# Patient Record
Sex: Male | Born: 1980 | Race: Black or African American | Hispanic: No | Marital: Married | Smoking: Never smoker
Health system: Southern US, Community
[De-identification: ages and names within clinical notes are randomized; demographics above are authoritative.]

## PROBLEM LIST (undated history)

## (undated) DIAGNOSIS — E119 Type 2 diabetes mellitus without complications: Secondary | ICD-10-CM

## (undated) DIAGNOSIS — S86019A Strain of unspecified Achilles tendon, initial encounter: Secondary | ICD-10-CM

## (undated) HISTORY — PX: PROSTATE BIOPSY: SHX241

## (undated) HISTORY — PX: ABDOMINAL HERNIA REPAIR: SHX539

---
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
## 2005-05-04 ENCOUNTER — Emergency Department (HOSPITAL_COMMUNITY): Admission: EM | Admit: 2005-05-04 | Discharge: 2005-05-04 | Payer: Self-pay | Admitting: Emergency Medicine

## 2005-05-05 ENCOUNTER — Emergency Department (HOSPITAL_COMMUNITY): Admission: EM | Admit: 2005-05-05 | Discharge: 2005-05-05 | Payer: Self-pay | Admitting: Emergency Medicine

## 2019-09-15 DIAGNOSIS — N4601 Organic azoospermia: Secondary | ICD-10-CM

## 2019-09-15 HISTORY — DX: Organic azoospermia: N46.01

## 2020-07-21 ENCOUNTER — Encounter (HOSPITAL_COMMUNITY): Payer: Self-pay

## 2020-07-21 ENCOUNTER — Other Ambulatory Visit: Payer: Self-pay

## 2020-07-21 ENCOUNTER — Emergency Department (HOSPITAL_COMMUNITY)
Admission: EM | Admit: 2020-07-21 | Discharge: 2020-07-21 | Disposition: A | Discharge status: Home / Self Care | Payer: Self-pay | Attending: Emergency Medicine | Admitting: Emergency Medicine

## 2020-07-21 ENCOUNTER — Emergency Department (HOSPITAL_COMMUNITY): Payer: Self-pay

## 2020-07-21 DIAGNOSIS — S97102A Crushing injury of unspecified left toe(s), initial encounter: Secondary | ICD-10-CM

## 2020-07-21 DIAGNOSIS — Y93E6 Activity, residential relocation: Secondary | ICD-10-CM | POA: Insufficient documentation

## 2020-07-21 DIAGNOSIS — S92425A Nondisplaced fracture of distal phalanx of left great toe, initial encounter for closed fracture: Secondary | ICD-10-CM | POA: Insufficient documentation

## 2020-07-21 DIAGNOSIS — W231XXA Caught, crushed, jammed, or pinched between stationary objects, initial encounter: Secondary | ICD-10-CM | POA: Insufficient documentation

## 2020-07-21 DIAGNOSIS — Y999 Unspecified external cause status: Secondary | ICD-10-CM | POA: Insufficient documentation

## 2020-07-21 DIAGNOSIS — Y92009 Unspecified place in unspecified non-institutional (private) residence as the place of occurrence of the external cause: Secondary | ICD-10-CM | POA: Insufficient documentation

## 2020-07-21 DIAGNOSIS — S97112A Crushing injury of left great toe, initial encounter: Secondary | ICD-10-CM | POA: Insufficient documentation

## 2020-07-21 MED ORDER — HYDROCODONE-ACETAMINOPHEN 5-325 MG PO TABS
1.0000 | ORAL_TABLET | Freq: Four times a day (QID) | ORAL | 0 refills | Status: DC | PRN
Start: 2020-07-21 — End: 2022-12-25

## 2020-07-21 NOTE — ED Triage Notes (Signed)
Pt presents with c/o right foot injury. Pt reports that on Friday, a lift gate fell on his foot. Pt reports swelling and pain with ambulation.

## 2020-07-21 NOTE — ED Provider Notes (Signed)
[facility] EMERGENCY DEPARTMENT Provider Note  CSN: [id number] Arrival date & time: 07/21/20 8676    History Chief Complaint  Patient presents with  . Foot Injury    HPI  Timothy Rivera is a 39 y.o. male reports he was helping a friend move 2 days ago when he dropped a lift gate on his R great toe. Complaining of moderate to severe pain and swelling since. Worse with bearing weight.   History reviewed. No pertinent past medical history.  Past Surgical History:  Procedure Laterality Date  . ABDOMINAL HERNIA REPAIR      No family history on file.  Social History   Tobacco Use  . Smoking status: Never Smoker  . Smokeless tobacco: Never Used  Substance Use Topics  . Alcohol use: Yes    Comment: socially   . Drug use: Never     Home Medications Prior to Admission medications   Medication Sig Start Date End Date Taking? Authorizing Provider  acetaminophen (TYLENOL) 325 MG tablet Take 650 mg by mouth every 6 (six) hours as needed for mild pain, fever or headache.   Yes [provider]  HYDROcodone-acetaminophen (NORCO/VICODIN) 5-325 MG tablet Take 1 tablet by mouth every 6 (six) hours as needed for severe pain. 07/21/20   Pollyann Savoy, MD     Allergies    Patient has no known allergies.   Review of Systems   Review of Systems A comprehensive review of systems was completed and negative except as noted in HPI.    Physical Exam BP (!) 144/106 (BP Location: Right Arm)   Pulse 84   Temp 98.2 F (36.8 C) (Oral)   Resp 18   SpO2 98%   Physical Exam Vitals and nursing note reviewed.  HENT:     Head: Normocephalic.     Nose: Nose normal.  Eyes:     Extraocular Movements: Extraocular movements intact.  Pulmonary:     Effort: Pulmonary effort is normal.  Musculoskeletal:        General: Normal range of motion.     Cervical back: Neck supple.     Comments: Marked swelling and tenderness to R great toe with a large fluid and blood filled  blister that extends under the nail  Skin:    Findings: No rash (on exposed skin).  Neurological:     Mental Status: He is alert and oriented to person, place, and time.  Psychiatric:        Mood and Affect: Mood normal.        ED Results / Procedures / Treatments   Labs (all labs ordered are listed, but only abnormal results are displayed) Labs Reviewed - No data to display  EKG None  Radiology DG Toe Great Right  Result Date: 07/21/2020 CLINICAL DATA:  Pt presented with c/o right great toe injury after a lift gate from a truck fell on his right foot two days ago. Pt had swelling to his right great toe and pain with ambulation. EXAM: RIGHT GREAT TOE COMPARISON:  None. FINDINGS: There is a complex nondisplaced fracture involving the tuft of the great toe. No evidence of dislocation. No significant degenerative change. Regional soft tissues are unremarkable. IMPRESSION: Complex nondisplaced fracture involving the tuft of the right great toe. Electronically Signed   By: Emmaline Kluver M.D.   On: 07/21/2020 09:12    Procedures .Nerve Block  Date/Time: 07/21/2020 9:48 AM Performed by: Pollyann Savoy, MD Authorized by: Pollyann Savoy, MD  Consent:    Consent obtained:  Verbal   Consent given by:  Patient Indications:    Indications:  Pain relief Location:    Body area:  Lower extremity   Lower extremity nerve blocked: toe.   Laterality:  Left Pre-procedure details:    Skin preparation:  Alcohol Procedure details (see MAR for exact dosages):    Anesthetic injected:  Lidocaine 1% w/o epi Post-procedure details:    Dressing:  None   Patient tolerance of procedure:  Tolerated well, no immediate complications    Medications Ordered in the ED Medications - No data to display   MDM Rules/Calculators/A&P MDM Patient with crush injury of toe, now with blood filled blister. Will check xray for underlying fracture.   ED Course  I have reviewed the triage vital  signs and the nursing notes.  Pertinent labs & imaging results that were available during my care of the patient were reviewed by me and considered in my medical decision making (see chart for details).  Clinical Course as of Jul 21 1044  Wynelle Link Jul 21, 2020  9242 Xrays reviewed with patient. Digital block for comfort. Will discuss with Ortho management of this fracture in light of his large blister.    [CS]  1038 Spoke with Dr. Ranell Patrick who does not recommend opening blister due to concerns for open fracture. Recommends ace wrapping foot, post-op shoe. Outpatient follow up.    [CS]    Clinical Course User Index [CS] Pollyann Savoy, MD    Final Clinical Impression(s) / ED Diagnoses Final diagnoses:  Crushing injury of toe of left foot, initial encounter  Closed nondisplaced fracture of distal phalanx of left great toe, initial encounter    Rx / DC Orders ED Discharge Orders         Ordered    HYDROcodone-acetaminophen (NORCO/VICODIN) 5-325 MG tablet  Every 6 hours PRN        07/21/20 1041           Pollyann Savoy, MD 07/21/20 1045

## 2021-08-24 IMAGING — CR DG TOE GREAT 2+V*R*
3 series · 3 of 3 positions shown · non-contrast
Comparison: None.

CLINICAL DATA: Pt presented with c/o right great toe injury after a
lift gate from a truck fell on his right foot two days ago. Pt had
swelling to his right great toe and pain with ambulation.

EXAM:
RIGHT GREAT TOE

[x toes ap right]
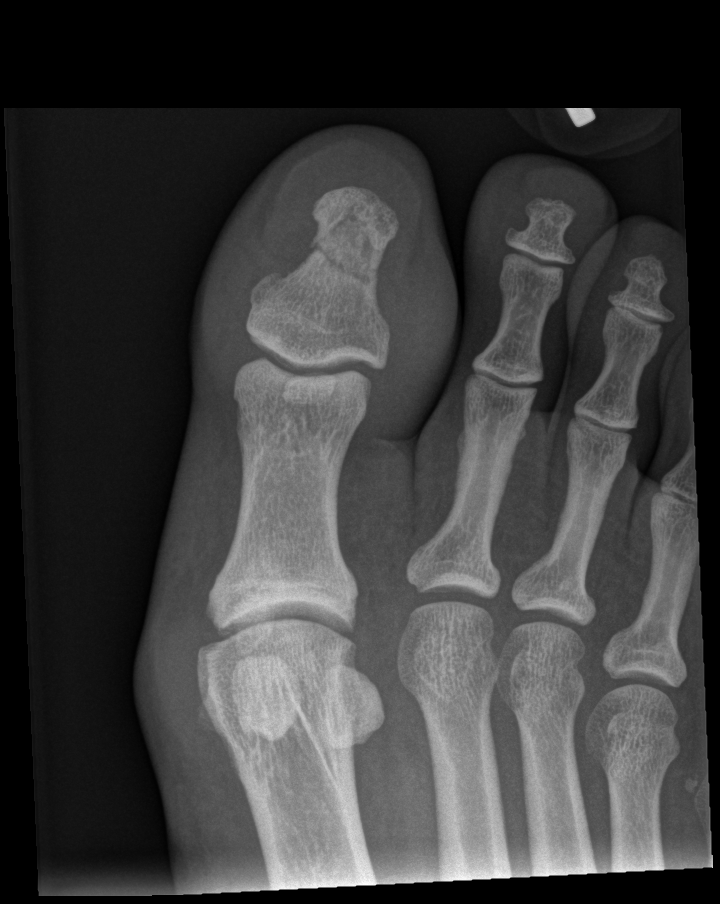

[x toes obl right]
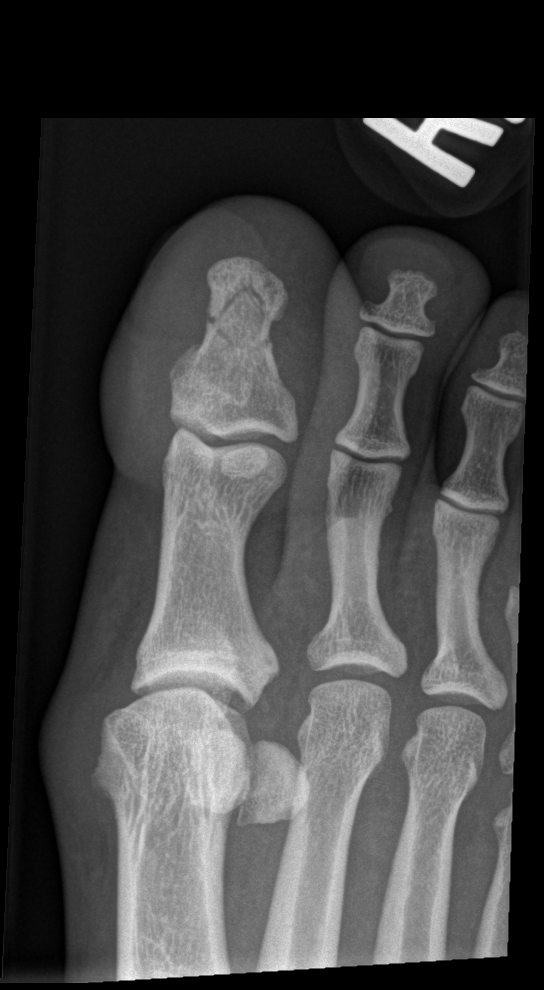

[x toes lat right]
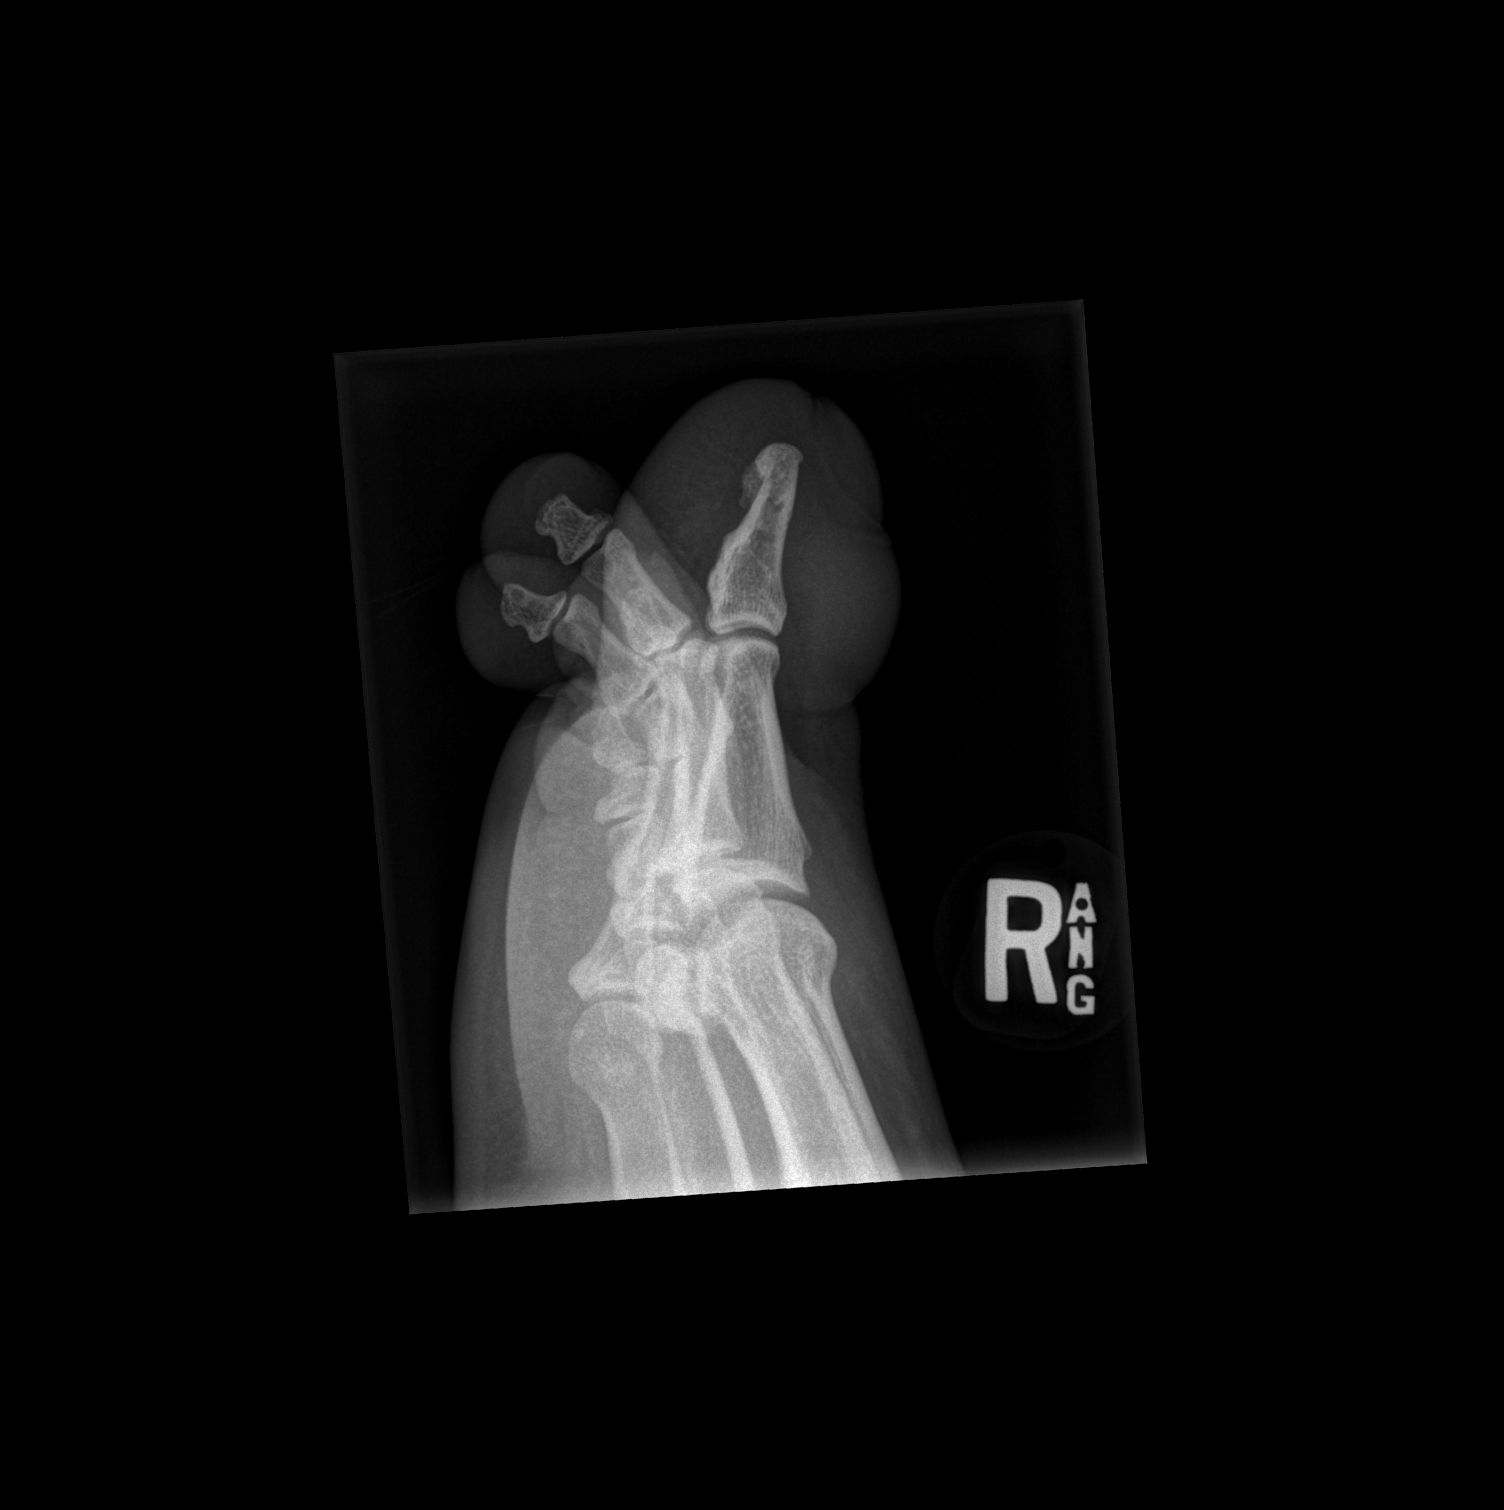

[3 of 3 positions shown; findings below may reference images not displayed]

FINDINGS: There is a complex nondisplaced fracture involving the tuft of the
great toe. No evidence of dislocation. No significant degenerative
change. Regional soft tissues are unremarkable.
IMPRESSION: Complex nondisplaced fracture involving the tuft of the right great
toe.

## 2022-09-24 ENCOUNTER — Ambulatory Visit: Payer: Self-pay | Admitting: Internal Medicine

## 2022-11-05 DIAGNOSIS — R972 Elevated prostate specific antigen [PSA]: Secondary | ICD-10-CM

## 2022-11-05 HISTORY — DX: Elevated prostate specific antigen (PSA): R97.20

## 2022-11-10 ENCOUNTER — Other Ambulatory Visit: Payer: Self-pay | Admitting: Orthopaedic Surgery

## 2022-11-10 DIAGNOSIS — S86012A Strain of left Achilles tendon, initial encounter: Secondary | ICD-10-CM

## 2022-11-11 ENCOUNTER — Ambulatory Visit: Payer: Self-pay | Admitting: Family Medicine

## 2022-11-12 DIAGNOSIS — R310 Gross hematuria: Secondary | ICD-10-CM

## 2022-11-12 HISTORY — DX: Gross hematuria: R31.0

## 2022-11-19 ENCOUNTER — Other Ambulatory Visit: Payer: Self-pay | Admitting: Urology

## 2022-11-19 DIAGNOSIS — N4341 Spermatocele of epididymis, single: Secondary | ICD-10-CM

## 2022-11-23 ENCOUNTER — Other Ambulatory Visit: Payer: Self-pay

## 2022-11-25 ENCOUNTER — Other Ambulatory Visit: Payer: Self-pay

## 2022-11-26 ENCOUNTER — Other Ambulatory Visit: Payer: Self-pay

## 2022-11-26 ENCOUNTER — Other Ambulatory Visit (HOSPITAL_COMMUNITY): Payer: Self-pay | Admitting: Urology

## 2022-11-26 ENCOUNTER — Inpatient Hospital Stay
Admission: RE | Admit: 2022-11-26 | Discharge: 2022-11-26 | Disposition: A | Discharge status: Home / Self Care | Payer: Self-pay | Source: Ambulatory Visit | Attending: Radiation Oncology | Admitting: Radiation Oncology

## 2022-11-26 DIAGNOSIS — C61 Malignant neoplasm of prostate: Secondary | ICD-10-CM

## 2022-11-26 DIAGNOSIS — C519 Malignant neoplasm of vulva, unspecified: Secondary | ICD-10-CM

## 2022-11-27 ENCOUNTER — Telehealth: Payer: Self-pay

## 2022-11-27 NOTE — Telephone Encounter (Signed)
    1. I confirmed with the patient he is aware of his referral to the clinic 4/12, arriving @ 8:00 am   2. I discussed the format of the clinic and the physicians he will be seeing that day.   3. I discussed where the clinic is located and how to contact me.   4. I confirmed his address and informed him I would be mailing a packet of information and forms to be completed. I asked him to bring them with him the day of his appointment.    He voiced understanding of the above. I asked him to call me if he has any questions or concerns regarding his appointments or the forms he needs to complete.

## 2022-12-02 ENCOUNTER — Other Ambulatory Visit: Payer: Self-pay

## 2022-12-15 ENCOUNTER — Encounter (HOSPITAL_COMMUNITY)
Admission: RE | Admit: 2022-12-15 | Discharge: 2022-12-15 | Disposition: A | Discharge status: Home / Self Care | Payer: 59 | Source: Ambulatory Visit | Attending: Urology | Admitting: Urology

## 2022-12-15 DIAGNOSIS — C778 Secondary and unspecified malignant neoplasm of lymph nodes of multiple regions: Secondary | ICD-10-CM | POA: Diagnosis present

## 2022-12-15 DIAGNOSIS — C519 Malignant neoplasm of vulva, unspecified: Secondary | ICD-10-CM | POA: Insufficient documentation

## 2022-12-15 MED ORDER — PIFLIFOLASTAT F 18 (PYLARIFY) INJECTION
9.0000 | Freq: Once | INTRAVENOUS | Status: AC
  Administered 2022-12-15: 9.14 via INTRAVENOUS

## 2022-12-24 ENCOUNTER — Encounter: Payer: Self-pay | Admitting: Urology

## 2022-12-24 DIAGNOSIS — C61 Malignant neoplasm of prostate: Secondary | ICD-10-CM | POA: Insufficient documentation

## 2022-12-24 NOTE — Progress Notes (Signed)
                               Care Plan Summary  Name: Timothy Rivera DOB: 08-17-1981   Your Medical Team:   Urologist -  Dr. Heloise Purpura, [facility]  Radiation Oncologist - Dr. Margaretmary Dys, [facility]   Medical Oncologist - Dr. Malachy Mood, [facility]  Recommendations: 1) Hormonal therapy  2) Chemotherapy    * These recommendations are based on information available as of today's consult.      Recommendations may change depending on the results of further tests or exams.      When appointments need to be scheduled, you will be contacted by [facility] and/or [facility].  Questions?  Please do not hesitate to call Cherlyn Cushing, BSN, RN at [phone] with any questions or concerns.  Marisue Ivan is your Oncology Nurse Navigator and is available to assist you while you're receiving your medical care at [facility].

## 2022-12-24 NOTE — Progress Notes (Signed)
RN attempted to reach patient for reminder of upcoming [facility] appointment on 4/12.    Voicemail box full.  My Chart message sent for reminder.

## 2022-12-25 ENCOUNTER — Ambulatory Visit: Payer: Self-pay | Admitting: Hematology

## 2022-12-25 ENCOUNTER — Inpatient Hospital Stay: Payer: 59

## 2022-12-25 ENCOUNTER — Other Ambulatory Visit: Payer: Self-pay | Admitting: Genetic Counselor

## 2022-12-25 ENCOUNTER — Encounter: Payer: Self-pay | Admitting: Genetic Counselor

## 2022-12-25 ENCOUNTER — Inpatient Hospital Stay: Payer: 59 | Attending: Hematology | Admitting: Hematology

## 2022-12-25 ENCOUNTER — Encounter: Payer: Self-pay | Admitting: Hematology

## 2022-12-25 ENCOUNTER — Ambulatory Visit
Admission: RE | Admit: 2022-12-25 | Discharge: 2022-12-25 | Disposition: A | Discharge status: Home / Self Care | Payer: 59 | Source: Ambulatory Visit | Attending: Radiation Oncology | Admitting: Radiation Oncology

## 2022-12-25 ENCOUNTER — Inpatient Hospital Stay (HOSPITAL_BASED_OUTPATIENT_CLINIC_OR_DEPARTMENT_OTHER): Payer: 59 | Admitting: Genetic Counselor

## 2022-12-25 ENCOUNTER — Encounter: Payer: Self-pay | Admitting: Radiation Oncology

## 2022-12-25 VITALS — BP 140/90 | HR 107 | Temp 96.8°F | Resp 20 | Ht 74.0 in | Wt 268.0 lb

## 2022-12-25 DIAGNOSIS — Z1379 Encounter for other screening for genetic and chromosomal anomalies: Secondary | ICD-10-CM

## 2022-12-25 DIAGNOSIS — C61 Malignant neoplasm of prostate: Secondary | ICD-10-CM | POA: Insufficient documentation

## 2022-12-25 DIAGNOSIS — C7800 Secondary malignant neoplasm of unspecified lung: Secondary | ICD-10-CM | POA: Diagnosis not present

## 2022-12-25 DIAGNOSIS — E119 Type 2 diabetes mellitus without complications: Secondary | ICD-10-CM | POA: Diagnosis not present

## 2022-12-25 DIAGNOSIS — Z7984 Long term (current) use of oral hypoglycemic drugs: Secondary | ICD-10-CM | POA: Diagnosis not present

## 2022-12-25 DIAGNOSIS — Z801 Family history of malignant neoplasm of trachea, bronchus and lung: Secondary | ICD-10-CM | POA: Insufficient documentation

## 2022-12-25 DIAGNOSIS — Z5111 Encounter for antineoplastic chemotherapy: Secondary | ICD-10-CM | POA: Insufficient documentation

## 2022-12-25 DIAGNOSIS — C7951 Secondary malignant neoplasm of bone: Secondary | ICD-10-CM | POA: Insufficient documentation

## 2022-12-25 DIAGNOSIS — I10 Essential (primary) hypertension: Secondary | ICD-10-CM | POA: Diagnosis not present

## 2022-12-25 HISTORY — DX: Type 2 diabetes mellitus without complications: E11.9

## 2022-12-25 HISTORY — DX: Strain of unspecified achilles tendon, initial encounter: S86.019A

## 2022-12-25 LAB — GENETIC SCREENING ORDER

## 2022-12-25 NOTE — Progress Notes (Signed)
REFERRING PROVIDER: Malachy Mood, MD  PRIMARY PROVIDER:  Orpha Bur, MD  PRIMARY REASON FOR VISIT:  Encounter Diagnosis  Name Primary?   Malignant neoplasm of prostate Yes    HISTORY OF PRESENT ILLNESS:   Timothy Rivera, a 42 y.o. male, was seen for a [place] cancer genetics consultation at the request of Timothy Rivera due to a personal history of cancer.  Timothy Rivera presents to clinic today to discuss the possibility of a hereditary predisposition to cancer, to discuss genetic testing, and to further clarify his future cancer risks, as well as potential cancer risks for family members.   Timothy Rivera was diagnosed with metastatic prostate cancer at age 40 (Gleason score: 9).   CANCER HISTORY:  Oncology History Overview Note   Cancer Staging  Malignant neoplasm of prostate Staging form: Prostate, AJCC 8th Edition - Clinical stage from 11/05/2022: Stage IVB (cT1c, cN1, pM1c, PSA: 162, Grade Group: 5) - Signed by Timothy Fennel, PA-C on 12/24/2022 Histopathologic type: Adenocarcinoma, NOS Stage prefix: Initial diagnosis Prostate specific antigen (PSA) range: 20 or greater Gleason primary pattern: 4 Gleason secondary pattern: 5 Gleason score: 9 Histologic grading system: 5 grade system Number of biopsy cores examined: 12 Number of biopsy cores positive: 12     Malignant neoplasm of prostate  11/05/2022 Cancer Staging   Staging form: Prostate, AJCC 8th Edition - Clinical stage from 11/05/2022: Stage IVB (cT1c, cN1, pM1c, PSA: 162, Grade Group: 5) - Signed by Timothy Fennel, PA-C on 12/24/2022 Histopathologic type: Adenocarcinoma, NOS Stage prefix: Initial diagnosis Prostate specific antigen (PSA) range: 20 or greater Gleason primary pattern: 4 Gleason secondary pattern: 5 Gleason score: 9 Histologic grading system: 5 grade system Number of biopsy cores examined: 12 Number of biopsy cores positive: 12   12/15/2022 PET scan    IMPRESSION: 1. Prostatic primary or primaries with nodal  metastasis within the chest, abdomen, and pelvis. 2. The hepatic lesion on prior diagnostic CT is not tracer avid, arguing against metastatic disease. Likely an incidental benign lesion. 3. Widespread osseous metastasis.     12/24/2022 Initial Diagnosis   Malignant neoplasm of prostate      No past medical history on file.  Past Surgical History:  Procedure Laterality Date   ABDOMINAL HERNIA REPAIR      Social History   Socioeconomic History   Marital status: Married    Spouse name: Not on file   Number of children: Not on file   Years of education: Not on file   Highest education level: Not on file  Occupational History   Not on file  Tobacco Use   Smoking status: Never   Smokeless tobacco: Never  Substance and Sexual Activity   Alcohol use: Yes    Comment: socially    Drug use: Never   Sexual activity: Not on file  Other Topics Concern   Not on file  Social History Narrative   Not on file   Social Determinants of Health   Financial Resource Strain: Not on file  Food Insecurity: Not on file  Transportation Needs: Not on file  Physical Activity: Not on file  Stress: Not on file  Social Connections: Not on file     FAMILY HISTORY:  We obtained a detailed, 4-generation family history.  Significant diagnoses are listed below: Family History  Problem Relation Age of Onset   Cancer Maternal Grandmother 63       unknown type   Lung cancer Other  heavy smoker   Timothy Rivera is unaware of previous family history of genetic testing for hereditary cancer risks and there is no reported Ashkenazi Jewish ancestry.      GENETIC COUNSELING ASSESSMENT: Timothy Rivera is a 42 y.o. male with a personal history of cancer which is somewhat suggestive of a hereditary predisposition to cancer given his metastatic prostate cancer. We, therefore, discussed and recommended the following at today's visit.   DISCUSSION: We discussed that 5 - 10% of cancer is hereditary, with  most cases of prostate cancer associated with BRCA1/2.  There are other genes that can be associated with hereditary prostate cancer syndromes.  We discussed that testing is beneficial for several reasons including knowing how to follow individuals after completing their treatment, identifying whether potential treatment options would be beneficial, and understanding if other family members could be at risk for cancer and allowing them to undergo genetic testing.   We reviewed the characteristics, features and inheritance patterns of hereditary cancer syndromes. We also discussed genetic testing, including the appropriate family members to test, the process of testing, insurance coverage and turn-around-time for results. We discussed the implications of a negative, positive, carrier and/or variant of uncertain significant result. We recommended Timothy Rivera pursue genetic testing for a panel that includes genes associated with prostate cancer.   Timothy Rivera elected to have [facility] Custom Panel. The Custom Hereditary Cancers Panel offered by [facility] includes sequencing and/or deletion duplication testing of the following 43 genes: APC, ATM, AXIN2, BAP1, BARD1, BMPR1A, BRCA1, BRCA2, BRIP1, CDH1, CDK4, CDKN2A (p14ARF and p16INK4a only), CHEK2, CTNNA1, EPCAM (Deletion/duplication testing only), FH, GREM1 (promoter region duplication testing only), HOXB13, KIT, MBD4, MEN1, MLH1, MSH2, MSH3, MSH6, MUTYH, NF1, NHTL1, PALB2, PDGFRA, PMS2, POLD1, POLE, PTEN, RAD51C, RAD51D, SMAD4, SMARCA4. STK11, TP53, TSC1, TSC2, and VHL.  Based on Timothy Rivera personal history of cancer, he meets medical criteria for genetic testing. Despite that he meets criteria, he may still have an out of pocket cost. We discussed that if his out of pocket cost for testing is over $100, the laboratory will call and confirm whether he wants to proceed with testing.  If the out of pocket cost of testing is less than $100 he will be billed by the genetic  testing laboratory.   PLAN: After considering the risks, benefits, and limitations, Timothy Rivera provided informed consent to pursue genetic testing and the blood sample was sent to [facility] for analysis of the Custom Panel. Results should be available within approximately 2-3 weeks' time, at which point they will be disclosed by telephone to Mr. Gaede, as will any additional recommendations warranted by these results. Mr. Camfield will receive a summary of his genetic counseling visit and a copy of his results once available. This information will also be available in Epic.   Mr. Pena questions were answered to his satisfaction today. Our contact information was provided should additional questions or concerns arise. Thank you for the referral and allowing [place] to share in the care of your patient.   Lalla Brothers, MS, [facility] Genetic Counselor [email or web address] (P) [phone]  The patient was seen for a total of 20 minutes in face-to-face genetic counseling.  The patient brought his wife. Drs. Pamelia Hoit and/or Mosetta Rivera were available to discuss this case as needed.   _______________________________________________________________________ For Office Staff:  Number of people involved in session: 2 Was an Intern/ student involved with case: no

## 2022-12-25 NOTE — Progress Notes (Signed)
[facility]   Telephone:([phone] Fax:([phone]   Clinic New Consult Note   Patient Care Team: Orpha Bur, MD as PCP - General (Family Medicine) Malachy Mood, MD as Consulting Physician (Hematology and Oncology) Despina Arias, MD as Referring Physician (Urology) Christena Deem, MD as Consulting Physician (Sports Medicine) Bjorn Pippin, MD as Consulting Physician (Orthopedic Surgery)  Date of Service:  12/25/2022   CHIEF COMPLAINTS/PURPOSE OF CONSULTATION:  Prostate Cancer  REFERRING PHYSICIAN:  [facility]  Traci Sermon, MD  ASSESSMENT & PLAN:  Timothy Rivera is a 42 y.o.  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 -I reviewed his prostate biopsy results, and personally reviewed the PET scan images with patient and his wife in detail. -Unfortunately the PSMA scan showed diffuse bone metastasis, in spine, pelvic, ribs, humerus and femurs, he also has diffuse hypermetabolic adenopathy in thoracic, abdomen and pelvis.  The liver lesion was not hypermetabolic, so unlikely related to his prostate cancer. -I discussed the incurable nature of the disease, and aggressive biology of his cancer due to the high PSA and Gleason score. -Due to the high disease burden, I recommend triple therapy with androgen deprivation, Zytiga and prednisone, and chemotherapy docetaxel every 3 weeks for 6 cycles. 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 --Chemotherapy consent: Side effects including but does not not limited to, fatigue, nausea, vomiting, diarrhea, hair loss, neuropathy, fluid retention, renal and kidney dysfunction, neutropenic fever, needed for blood transfusion, bleeding, were discussed with patient in great detail. he agrees to proceed. -The goal of therapy is palliative, to control his disease and prolong his life. -We discussed a port placement for chemotherapy, he will think about it. -genetic referral   2. Bone metastasis -I recommend him to start calcium and vitamin D supplement -I discussed biphosphonate, and recommend him to start Zometa every 3 months.  Potential benefit and side effects were discussed with him, especially risk of jaw necrosis.  I encouraged him to follow-up with his dentist, and get clearance.   PLAN:  - Discuss PET scan and biopsy findings  -Recommend antigen deprivation therapy, to start Firmago next week, then Eligard every 4 months, will call in Zytiga and prednisonel. Discuss side effects -Discuss Docetaxel chemo intravenous  and its side effects - Discuss Zometa  - Docetaxel q3 week for 6 cycles  - Discuss Port placement -Discuss Genetic Testing -Recommend pt taking Calcium and Vitamin  D supplement -f/u with first cycle chemo    Oncology History Overview Note   Cancer Staging  Malignant neoplasm of prostate Staging form: Prostate, AJCC 8th Edition - Clinical stage from 11/05/2022: Stage IVB (cT1c, cN1, pM1c, PSA: 162, Grade Group: 5) - Signed by Marcello Fennel, PA-C on 12/24/2022 Histopathologic type: Adenocarcinoma, NOS Stage prefix: Initial diagnosis Prostate specific antigen (PSA) range: 20 or greater Gleason primary pattern: 4 Gleason secondary pattern: 5 Gleason score: 9 Histologic grading system: 5 grade system Number of biopsy cores examined: 12 Number of biopsy cores positive: 12     Malignant neoplasm of  prostate  11/05/2022 Cancer Staging   Staging form:  Prostate, AJCC 8th Edition - Clinical stage from 11/05/2022: Stage IVB (cT1c, cN1, pM1c, PSA: 162, Grade Group: 5) - Signed by Marcello Fennel, PA-C on 12/24/2022 Histopathologic type: Adenocarcinoma, NOS Stage prefix: Initial diagnosis Prostate specific antigen (PSA) range: 20 or greater Gleason primary pattern: 4 Gleason secondary pattern: 5 Gleason score: 9 Histologic grading system: 5 grade system Number of biopsy cores examined: 12 Number of biopsy cores positive: 12   12/15/2022 PET scan    IMPRESSION: 1. Prostatic primary or primaries with nodal metastasis within the chest, abdomen, and pelvis. 2. The hepatic lesion on prior diagnostic CT is not tracer avid, arguing against metastatic disease. Likely an incidental benign lesion. 3. Widespread osseous metastasis.     12/24/2022 Initial Diagnosis   Malignant neoplasm of prostate      HISTORY OF PRESENTING ILLNESS:  Timothy Rivera 42 y.o. male is a here because of Prostate Cancer. The patient was referred by Traci Sermon, MD. The patient presents to the clinic today accompanied by his wife, and his father was also on the phone.  Patient had a episode of gross hematuria, which which prompted lab work including PSA.  His PSA came back significantly elevated at 162.  He was referred to see urology, and seen by Dr. Lafonda Mosses on 11/05/2022.  He underwent prostate biopsy on November 05, 2022.  All of the 12 core biopsies showed prostatic adenocarcinoma, Gleason score from 4+3=7 to 4+5 = 9 involves 80% to 95% of the cores.  He underwent PSMA scan on January 12, 2023, which showed diffusely hypermetabolic prostate gland, with nodal metastasis within the chest, abdomen pelvis, and diffuse osseous metastasis.  Pt had some pain in lower pelvic area for about a week. He  rate the pain at a 1, and was able to take some Tylenol. Pt had lost some weight back In January. Pt  state hat his weight lost was intentional due to diet change.  He feels well in  general, has normal energy, and appetite.  He used to go to gym regularly, stopped a few months ago due to the left ankle issue.  Today the patient notes they felt/feeling prior/after... Pt has reported that he had some pain in the lower side.  Patient and his wife has been try to have children without any successful.  He was found to have low sperm count.   He has a PMHx of.... Diabetic Hernia Maternal Aunt -lung cancer Maternal Grandmother   Socially... 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    REVIEW OF SYSTEMS:    Constitutional: Denies fevers, chills or abnormal night sweats Eyes: Denies blurriness of vision, double vision or watery eyes Ears, nose, mouth, throat, and face: Denies mucositis or sore throat Respiratory: Denies cough, dyspnea or wheezes Cardiovascular: Denies palpitation, chest discomfort or lower extremity swelling Gastrointestinal:  Denies nausea, heartburn or change in bowel habits Skin: Denies abnormal skin rashes Lymphatics: Denies new lymphadenopathy or easy bruising Neurological:Denies numbness, tingling or new weaknesses Behavioral/Psych: Mood is stable, no new changes  All other systems were reviewed with the patient and are negative.   MEDICAL HISTORY:  Past Medical History:  Diagnosis Date   Azoospermia non-obstructive 2021   Diabetes mellitus without complication    Elevated PSA 11/05/2022   10/09/2022   Gross hematuria 11/12/2022   11/05/2022, 10/09/2022   Torn Achilles tendon     SURGICAL HISTORY: Past Surgical History:  Procedure Laterality Date   LAPAROSCOPIC  INGUINAL HERNIA REPAIR  1991   PROSTATE BIOPSY      SOCIAL HISTORY: Social History   Socioeconomic History   Marital status: Married    Spouse name: Not on file   Number of children: Not on file   Years of education: Not on file   Highest education level: Not on file  Occupational History   Not on file  Tobacco Use   Smoking status: Never    Smokeless tobacco: Never  Vaping Use   Vaping Use: Never used  Substance and Sexual Activity   Alcohol use: Yes    Comment: socially   Drug use: Never   Sexual activity: Yes  Other Topics Concern   Not on file  Social History Narrative   Not on file   Social Determinants of Health   Financial Resource Strain: Not on file  Food Insecurity: Not on file  Transportation Needs: Not on file  Physical Activity: Not on file  Stress: Not on file  Social Connections: Not on file  Intimate Partner Violence: Not on file    FAMILY HISTORY: Family History  Problem Relation Age of Onset   Cancer Maternal Grandmother 11       unknown type   Lung cancer Other        heavy smoker    ALLERGIES:  has No Known Allergies.  MEDICATIONS:  Current Outpatient Medications  Medication Sig Dispense Refill   Accu-Chek FastClix Lancets MISC as directed for 90 days     Blood Glucose Monitoring Suppl (ACCU-CHEK AVIVA PLUS) w/Device KIT as directed for 90 days     Glucose Blood (BLOOD GLUCOSE TEST STRIPS 333) STRP as directed In Vitro once a day for 90 days     metFORMIN (GLUCOPHAGE) 1000 MG tablet Take 1,000 mg by mouth 2 (two) times daily with a meal.     milk thistle 175 MG tablet Take 175 mg by mouth daily.     pioglitazone (ACTOS) 30 MG tablet 1 tablet Orally Once a day for 90 days     No current facility-administered medications for this visit.    PHYSICAL EXAMINATION: ECOG PERFORMANCE STATUS: 0 - Asymptomatic  There were no vitals filed for this visit. There were no vitals filed for this visit.  GENERAL:alert, no distress and comfortable SKIN: skin color, texture, turgor are normal, no rashes or significant lesions EYES: normal, Conjunctiva are pink and non-injected, sclera clear NECK: (-) supple, thyroid normal size, non-tender, without nodularity LYMPH:(-)  no palpable lymphadenopathy in the cervical, axillary LUNGS: (-) clear to auscultation and percussion with normal breathing  effort HEART:(-)  regular rate & rhythm and no murmurs and (-) no lower extremity edema ABDOMEN:(-) abdomen soft, (-) non-tender and (-) normal bowel sounds   LABORATORY DATA:  I have reviewed the data as listed     No data to display              No data to display           RADIOGRAPHIC STUDIES: I have personally reviewed the radiological images as listed and agreed with the findings in the report. NM PET (PSMA) SKULL TO MID THIGH  Result Date: 12/16/2022 CLINICAL DATA:  Prostate cancer diagnosed 11/19/2022.  PSA of 162. EXAM: NUCLEAR MEDICINE PET SKULL BASE TO THIGH TECHNIQUE: 9.1 mCi F18 Piflufolastat (Pylarify) was injected intravenously. Full-ring PET imaging was performed from the skull base to thigh after the radiotracer. CT data was obtained and used for attenuation correction and anatomic  localization. COMPARISON:  11/12/2022 abdominopelvic CT from [facility]. FINDINGS: NECK No radiotracer activity in neck lymph nodes. Incidental CT finding: No cervical adenopathy. CHEST No pulmonary parenchymal hypermetabolism. A prevascular node measures 1.6 cm and a S.U.V. max of 4.2 on 88/4. Incidental CT finding: None. ABDOMEN/PELVIS Prostate: Multifocal prostatic tracer affinity. Example posterolateral right base at a S.U.V. max of 12.9. At the apex, bilaterally at a S.U.V. max of 8.8. Lymph nodes: Abdominal retroperitoneal tracer avid nodes. Example low left periaortic node of 1.5 cm and a S.U.V. max of 5.0 on 171/4. Bilateral pelvic sidewall tracer avid nodes. Example left external iliac 1.6 cm and a S.U.V. max of 7.7 on 198/4. Right internal iliac node measures 1.8 cm and a S.U.V. max of 7.3 on 200/4. Liver: The posterior right hepatic lobe hypoattenuating lesion is not tracer avid, including at 2.8 cm on 128/4. Incidental CT finding: Deferred to recent diagnostic CT. SKELETON Multifocal tracer avid osseous metastasis. Example right side of the sacrum at a S.U.V. max of 17.0. Within  the anterior L4 vertebral body at a S.U.V. max of 10.8. IMPRESSION: 1. Prostatic primary or primaries with nodal metastasis within the chest, abdomen, and pelvis. 2. The hepatic lesion on prior diagnostic CT is not tracer avid, arguing against metastatic disease. Likely an incidental benign lesion. 3. Widespread osseous metastasis. Electronically Signed   By: Jeronimo Greaves M.D.   On: 12/16/2022 10:19     No orders of the defined types were placed in this encounter.   All questions were answered. The patient knows to call the clinic with any problems, questions or concerns. The total time spent in the appointment was 60 minutes.     Malachy Mood, MD 12/25/2022  Carolin Coy am acting as scribe for Malachy Mood, MD.   I have reviewed the above documentation for accuracy and completeness, and I agree with the above.

## 2022-12-25 NOTE — Progress Notes (Signed)
Radiation Oncology         [phone] ________________________________  Multidisciplinary Prostate Cancer Clinic  Initial Radiation Oncology Consultation  Name: Timothy Rivera MRN: [id number]  Date: 12/25/2022  DOB: 11-19-1980  MO:QHUTML, Grayling Congress, MD  Despina Arias, MD   REFERRING PHYSICIAN: Despina Arias, MD  DIAGNOSIS: 42 y.o. gentleman with metastatic prostate cancer involving the lymph nodes in the chest/abdomen/pelvis and bony skeleton.    ICD-10-CM   1. Malignant neoplasm of prostate  C61       HISTORY OF PRESENT ILLNESS::Timothy Rivera is a 42 y.o. gentleman.  He was noted to have an elevated PSA of 123 on routine labs in December 2023 with his primary care physician, Dr. Rubye Oaks. A repeat lab a few weeks later confirmed persistent, significant elevation at 120.5. In light of this and episodes of gross hematuria, he was referred for evaluation in urology by Dr. Lafonda Mosses on 10/09/22. A repeat PSA obtained that day showed a further increase to 162. The patient proceeded to transrectal ultrasound with 12 biopsies of the prostate on 11/05/22.  The prostate volume measured 49.6 cc.  Out of 12 core biopsies, all 12 were positive.  The maximum Gleason score was 4+5, and this was seen in the left base lateral, left base, right base lateral, and right apex lateral. Additionally, Gleason 4+4 was seen in the right mid lateral, right apex, right mid, right base, and left mid. The remaining cores showed Gleason 4+3.  He underwent a CT A/P on 11/12/22 for his gross hematuria showing no urinary tract calculi, hydronephrosis, or filling defect but noted prostatomegaly with diffuse urinary bladder wall thickening and numerous enlarged bilateral iliac, pelvic sidewall, and retroperitoneal lymph nodes as well last a subtle hypodense lesion of the posterior right lobe of the liver. He underwent staging PSMA PET scan on 12/15/22 showing prostatic primary with nodal metastasis throughout the chest, abdomen,  pelvis and widespread osseous metastases.  The previously seen hepatic lesion is not tracer-avid and favored to be a benign, incidental finding on CT.  The patient reviewed the biopsy results with his urologist and he has kindly been referred today to the multidisciplinary prostate cancer clinic for presentation of pathology and radiology studies in our conference for discussion of potential radiation treatment options and clinical evaluation.  PREVIOUS RADIATION THERAPY: No  PAST MEDICAL HISTORY:  has a past medical history of Azoospermia non-obstructive (2021), Diabetes mellitus without complication, Elevated PSA (11/05/2022), Gross hematuria (11/12/2022), and Torn Achilles tendon.    PAST SURGICAL HISTORY: Past Surgical History:  Procedure Laterality Date   LAPAROSCOPIC INGUINAL HERNIA REPAIR  1991   PROSTATE BIOPSY      FAMILY HISTORY: family history includes Cancer (age of onset: 52) in his maternal grandmother; Lung cancer in an other family member.  SOCIAL HISTORY:  reports that he has never smoked. He has never used smokeless tobacco. He reports current alcohol use. He reports that he does not use drugs.  ALLERGIES: Patient has no known allergies.  MEDICATIONS:  Current Outpatient Medications  Medication Sig Dispense Refill   Accu-Chek FastClix Lancets MISC as directed for 90 days     Blood Glucose Monitoring Suppl (ACCU-CHEK AVIVA PLUS) w/Device KIT as directed for 90 days     Glucose Blood (BLOOD GLUCOSE TEST STRIPS 333) STRP as directed In Vitro once a day for 90 days     metFORMIN (GLUCOPHAGE) 1000 MG tablet Take 1,000 mg by mouth 2 (two) times daily with a meal.  milk thistle 175 MG tablet Take 175 mg by mouth daily.     pioglitazone (ACTOS) 30 MG tablet 1 tablet Orally Once a day for 90 days     No current facility-administered medications for this encounter.    REVIEW OF SYSTEMS:  On review of systems, the patient reports that he is doing well in general although  he has had some general feelings of fatigue/malaise as well as unintentional weight loss. He denies any chest pain, shortness of breath, cough, fevers, chills, or night sweats.. He denies any bowel disturbances, and denies abdominal pain, nausea or vomiting. He denies any new musculoskeletal or joint aches or pains. His IPSS was 16, indicating moderate urinary symptoms. His SHIM was 25, indicating he does not have erectile dysfunction. A complete review of systems is obtained and is otherwise negative.   PHYSICAL EXAM:  Wt Readings from Last 3 Encounters:  12/25/22 268 lb (121.6 kg)   Temp Readings from Last 3 Encounters:  12/25/22 (!) 96.8 F (36 C)  07/21/20 98.2 F (36.8 C) (Oral)   BP Readings from Last 3 Encounters:  12/25/22 (!) 140/90  07/21/20 (!) 127/94   Pulse Readings from Last 3 Encounters:  12/25/22 (!) 107  07/21/20 68    /10  In general this is a well appearing African-American man in no acute distress. He's alert and oriented x4 and appropriate throughout the examination. Cardiopulmonary assessment is negative for acute distress and he exhibits normal effort.    KPS = 100  100 - Normal; no complaints; no evidence of disease. 90   - Able to carry on normal activity; minor signs or symptoms of disease. 80   - Normal activity with effort; some signs or symptoms of disease. 42   - Cares for self; unable to carry on normal activity or to do active work. 60   - Requires occasional assistance, but is able to care for most of his personal needs. 50   - Requires considerable assistance and frequent medical care. 40   - Disabled; requires special care and assistance. 30   - Severely disabled; hospital admission is indicated although death not imminent. 20   - Very sick; hospital admission necessary; active supportive treatment necessary. 10   - Moribund; fatal processes progressing rapidly. 0     - Dead  Karnofsky DA, Abelmann WH, Craver LS and Burchenal JH [phone]) The use  of the nitrogen mustards in the palliative treatment of carcinoma: with particular reference to bronchogenic carcinoma Cancer 1 [id number]   LABORATORY DATA:  No results found for: "WBC", "HGB", "HCT", "MCV", "PLT" No results found for: "NA", "K", "CL", "CO2" No results found for: "ALT", "AST", "GGT", "ALKPHOS", "BILITOT"   RADIOGRAPHY: NM PET (PSMA) SKULL TO MID THIGH  Result Date: 12/16/2022 CLINICAL DATA:  Prostate cancer diagnosed 11/19/2022.  PSA of 162. EXAM: NUCLEAR MEDICINE PET SKULL BASE TO THIGH TECHNIQUE: 9.1 mCi F18 Piflufolastat (Pylarify) was injected intravenously. Full-ring PET imaging was performed from the skull base to thigh after the radiotracer. CT data was obtained and used for attenuation correction and anatomic localization. COMPARISON:  11/12/2022 abdominopelvic CT from [facility]. FINDINGS: NECK No radiotracer activity in neck lymph nodes. Incidental CT finding: No cervical adenopathy. CHEST No pulmonary parenchymal hypermetabolism. A prevascular node measures 1.6 cm and a S.U.V. max of 4.2 on 88/4. Incidental CT finding: Timothy Rivera. ABDOMEN/PELVIS Prostate: Multifocal prostatic tracer affinity. Example posterolateral right base at a S.U.V. max of 12.9. At the apex, bilaterally at a S.U.V.  max of 8.8. Lymph nodes: Abdominal retroperitoneal tracer avid nodes. Example low left periaortic node of 1.5 cm and a S.U.V. max of 5.0 on 171/4. Bilateral pelvic sidewall tracer avid nodes. Example left external iliac 1.6 cm and a S.U.V. max of 7.7 on 198/4. Right internal iliac node measures 1.8 cm and a S.U.V. max of 7.3 on 200/4. Liver: The posterior right hepatic lobe hypoattenuating lesion is not tracer avid, including at 2.8 cm on 128/4. Incidental CT finding: Deferred to recent diagnostic CT. SKELETON Multifocal tracer avid osseous metastasis. Example right side of the sacrum at a S.U.V. max of 17.0. Within the anterior L4 vertebral body at a S.U.V. max of 10.8. IMPRESSION: 1. Prostatic  primary or primaries with nodal metastasis within the chest, abdomen, and pelvis. 2. The hepatic lesion on prior diagnostic CT is not tracer avid, arguing against metastatic disease. Likely an incidental benign lesion. 3. Widespread osseous metastasis. Electronically Signed   By: Jeronimo Greaves M.D.   On: 12/16/2022 10:19      IMPRESSION/PLAN: 42 y.o. gentleman with metastatic prostate cancer involving the lymph nodes in the chest/abdomen/pelvis and bony skeleton.  We discussed the patient's workup and outlined the nature of prostate cancer in this setting. The patient's T stage, Gleason's score, and PSA put him into the very high risk group and PSMA PET scan confirms stage IV disease with diffuse metastatic disease involving the lymph nodes and bony skeleton. Accordingly, the treatment recommendation is for systemic therapy with total androgen blockade in addition to chemotherapy. We discussed the role of palliative radiotherapy in the management of any painful bony metastases but he is currently without any complaints related to the skeletal metastases.  We also briefly discussed the role of Xofigo/Pluvicto in the setting of residual osseous disease despite systemic therapy.  He appears to have a good understanding of his disease and our treatment recommendations which are of palliative intent.  He and his wife were encouraged to ask questions that were answered to their stated satisfaction.  At the end of the conversation the patient is interested in moving forward with the recommended systemic therapy with total androgen blockade in addition to chemotherapy.  He will also meet with Dr. Mosetta Putt, medical oncologist, as part of our multidisciplinary clinic today to discuss this further and she will help to make arrangements to start his treatment in the very near future.  We enjoyed meeting him and his wife today and look forward to following his progress.  Of course, if there is any indication for radiotherapy in  the future, we will be more than happy to continue to participate in his care.  We personally spent 60 minutes in this encounter including chart review, reviewing radiological studies, meeting face-to-face with the patient, entering orders and completing documentation.    Marguarite Arbour, PA-C    Margaretmary Dys, MD  [facility]  Radiation Oncology Direct Dial: [phone]  Fax: [phone] [email or web address]  Skype  [email or web address]    This document serves as a record of services personally performed by Margaretmary Dys, MD and Marcello Fennel, PA-C. It was created on their behalf by Mickie Bail, a trained medical scribe. The creation of this record is based on the scribe's personal observations and the provider's statements to them. This document has been checked and approved by the attending provider.

## 2022-12-25 NOTE — Consult Note (Signed)
Multi-Disciplinary Clinic     12/25/2022   --------------------------------------------------------------------------------   Glenna Durand  MRN: [id number]  DOB: 04/23/1981, 42 year old Male  SSN:    PRIMARY CARE:     REFERRING:  Cheree Ditto L. Lafonda Mosses, MD  PROVIDER:  Alen Blew. Lafonda Mosses, M.D.  TREATING:  Heloise Purpura, M.D.  LOCATION:  [facility] [phone]     --------------------------------------------------------------------------------   CC/HPI: CC: Prostate Cancer   Physician requesting consult: Dr. Irine Seal  PCP:  Location of consult: [facility] - Prostate Cancer Multidisciplinary Clinic   Mr. Or is a 42 year old who presented to Dr. Lafonda Mosses with an elevated PSA of 120.5 and gross hematuria. He underwent evaluation including cystoscopy that was unremarkable and a CT scan (11/12/22) that demonstrated bilateral pelvic and retroperitoneal lymphadenopathy and a question of a right lobe liver lesion concerning for possible metastatic disease. A TRUS biopsy of the prostate was performed on 11/05/22 indicated Gleason 4+5=9 adenocarcinoma with 12 out of 12 biopsy cores positive for malignancy. PSMA PET imaging was performed. This confirmed widespread metastatic disease to the bone and various areas of lymphadenopathy. His hepatic lesion was not PSMA avid. He is currently asymptomatic.   Family history: None   Imaging studies: PSMA PET scan (12/16/2022)   PMH: He has a history of obesity (290 lbs).  PSH: Laparoscopic inguinal hernia repair.   TNM stage: cT1c  PSA: 120.5  Gleason score:4+5=9 (GG 5)  Biopsy (2/22/4): /12 cores positive  Left: L lateral apex (90%, 4+3=7, PNI), L apex (80%, 4+3=7), L lateral mid (95%, 4+3=7, PNI), L mid (95%, 4+4=8), L lateral base (90%, 4+5=9, PNI), L base (90%, 4+5=9, PNI)  Right: R apex (80%, 4+4=8, PNI), R lateral apex (90%, 4+5=9, PNI), R mid (95%, 4+4=8), R lateral mid (90%, 4+4=8), R base (90%, 4+4=8), R  lateral base (95%, 4+5=9)  Prostate volume: 49.6 cc   Urinary function: IPSS is 16.  Erectile function: SHIM score is 25.     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   Tobacco Use Assessment Completed: Used Tobacco in last 30 days? Drinks 4 drinks per week. Types of alcohol consumed: Liquor.  Drinks 1 caffeinated drink per day. 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    REVIEW OF SYSTEMS:    GU Review Male:   Patient denies frequent urination, hard to postpone urination, burning/ pain with urination, get up at night to urinate, leakage of urine, stream starts and stops, trouble starting your streams, and have to strain to urinate .  Gastrointestinal (Lower):   Patient denies diarrhea and constipation.  Gastrointestinal (Upper):   Patient denies nausea and vomiting.  Constitutional:   Patient denies fever, night sweats, weight loss,  and fatigue.  Skin:   Patient denies skin rash/ lesion and itching.  Eyes:   Patient denies blurred vision and double vision.  Ears/ Nose/ Throat:   Patient denies sore throat and sinus problems.  Hematologic/Lymphatic:   Patient denies  swollen glands and easy bruising.  Cardiovascular:   Patient denies leg swelling and chest pains.  Respiratory:   Patient denies cough and shortness of breath.  Endocrine:   Patient denies excessive thirst.  Musculoskeletal:   Patient denies back pain and joint pain.  Neurological:   Patient denies headaches and dizziness.  Psychologic:   Patient denies depression and anxiety.   VITAL SIGNS: None   MULTI-SYSTEM PHYSICAL EXAMINATION:    Constitutional: Well-nourished. No physical deformities. Normally developed. Good grooming.     Complexity of Data:  Lab Test Review:   PSA  Records Review:   Pathology Reports, Previous Patient Records  X-Ray Review: PET- PSMA Scan: Reviewed Films.     10/09/22  PSA  Total PSA 162.00 ng/mL   Notes:                     CLINICAL DATA: Prostate cancer diagnosed 11/19/2022. PSA of 162.   EXAM:  NUCLEAR MEDICINE PET SKULL BASE TO THIGH   TECHNIQUE:  9.1 mCi F18 Piflufolastat (Pylarify) was injected intravenously.  Full-ring PET imaging was performed from the skull base to thigh  after the radiotracer. CT data was obtained and used for attenuation  correction and anatomic localization.   COMPARISON: 11/12/2022 abdominopelvic CT from [facility].   FINDINGS:  NECK   No radiotracer activity in neck lymph nodes.   Incidental CT finding: No cervical adenopathy.   CHEST   No pulmonary parenchymal hypermetabolism. A prevascular node  measures 1.6 cm and a S.U.V. max of 4.2 on 88/4.   Incidental CT finding: None.   ABDOMEN/PELVIS   Prostate: Multifocal prostatic tracer affinity. Example  posterolateral right base at a S.U.V. max of 12.9. At the apex,  bilaterally at a S.U.V. max of 8.8.   Lymph nodes: Abdominal retroperitoneal tracer avid nodes. Example  low left periaortic node of 1.5 cm and a S.U.V. max of 5.0 on 171/4.   Bilateral pelvic sidewall tracer avid nodes.   Example left external iliac 1.6 cm and a S.U.V. max of 7.7 on  198/4.   Right internal iliac node measures 1.8 cm and a S.U.V. max of 7.3 on  200/4.   Liver: The posterior right hepatic lobe hypoattenuating lesion is  not tracer avid, including at 2.8 cm on 128/4.   Incidental CT finding: Deferred to recent diagnostic CT.   SKELETON   Multifocal tracer avid osseous metastasis. Example right side of the  sacrum at a S.U.V. max of 17.0.   Within the anterior L4 vertebral body at a S.U.V. max of 10.8.   IMPRESSION:  1. Prostatic primary or primaries with nodal metastasis within the  chest, abdomen, and pelvis.  2. The hepatic lesion on prior diagnostic CT is not tracer avid,  arguing against metastatic disease. Likely an incidental benign  lesion.  3. Widespread osseous metastasis.    Electronically Signed  By: Jeronimo Greaves M.D.  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 Widespread metastatic prostate cancer: I had a discussion with Mr. Cosman and his wife today and reviewed his PSMA PET scan results which confirmed the concern for widespread metastatic disease. We discussed that this is not a curable but a treatable situation. I did recommend that he proceed with genetic testing and he will speak with genetic counseling later this morning as well. He is scheduled to see Dr. Mosetta Putt later this morning to discuss his systemic treatment options. All questions were answered to his stated satisfaction. He will continue follow-up with oncology moving forward considering his need for aggressive systemic therapy.   CC: Dr. Irine Seal  Dr. Margaretmary Dys  Dr. Malachy Mood

## 2022-12-26 ENCOUNTER — Encounter: Payer: Self-pay | Admitting: Hematology

## 2022-12-27 ENCOUNTER — Encounter: Payer: Self-pay | Admitting: Hematology

## 2022-12-27 MED ORDER — PREDNISONE 5 MG PO TABS
5.0000 mg | ORAL_TABLET | Freq: Every day | ORAL | 3 refills | Status: DC
Start: 2022-12-27 — End: 2023-01-01
  Filled 2022-12-27: qty 30, 30d supply, fill #0

## 2022-12-27 MED ORDER — PROCHLORPERAZINE MALEATE 10 MG PO TABS: 10.0000 mg | ORAL_TABLET | Freq: Four times a day (QID) | ORAL | 1 refills | Status: DC | PRN

## 2022-12-27 MED ORDER — ONDANSETRON HCL 8 MG PO TABS: 8.0000 mg | ORAL_TABLET | Freq: Three times a day (TID) | ORAL | 1 refills | Status: DC | PRN

## 2022-12-27 MED ORDER — ABIRATERONE ACETATE 250 MG PO TABS
1000.0000 mg | ORAL_TABLET | Freq: Every day | ORAL | 2 refills | Status: DC
Start: 2022-12-27 — End: 2022-12-30
  Filled 2022-12-27: qty 120, 30d supply, fill #0

## 2022-12-27 NOTE — Progress Notes (Signed)
START ON PATHWAY REGIMEN - Prostate ° ° °  Abiraterone/prednisone: A cycle is every 28 days: °    Abiraterone acetate (conventional)  °    Prednisone  °  Docetaxel cycles 1 through 6: A cycle is every 21 days: °    Docetaxel  °    Pegfilgrastim-xxxx  ° °**Always confirm dose/schedule in your pharmacy ordering system** ° °Patient Characteristics: °Adenocarcinoma, Recurrent/New Systemic Disease (Including Biochemical Recurrence), Non-Castrate, M1 °Histology: Adenocarcinoma °Therapeutic Status: Recurrent/New Systemic Disease (Including Biochemical Recurrence) °Intent of Therapy: °Non-Curative / Palliative Intent, Discussed with Patient °

## 2022-12-28 ENCOUNTER — Other Ambulatory Visit: Payer: Self-pay

## 2022-12-28 ENCOUNTER — Telehealth: Payer: Self-pay | Admitting: Pharmacist

## 2022-12-28 ENCOUNTER — Telehealth: Payer: Self-pay | Admitting: Pharmacy Technician

## 2022-12-28 ENCOUNTER — Other Ambulatory Visit (HOSPITAL_COMMUNITY): Payer: Self-pay

## 2022-12-28 ENCOUNTER — Encounter: Payer: Self-pay | Admitting: Hematology

## 2022-12-28 ENCOUNTER — Other Ambulatory Visit: Payer: Self-pay | Admitting: Hematology

## 2022-12-28 DIAGNOSIS — C61 Malignant neoplasm of prostate: Secondary | ICD-10-CM

## 2022-12-28 NOTE — Progress Notes (Signed)
RN spoke with patient and reviewed upcoming appointment for port placement @  on 4/18 at 9am with arrival time of 7am. Pt notified of instructions, verbalized understanding and agreement.   Plan of care in progress.

## 2022-12-28 NOTE — Telephone Encounter (Signed)
Oral Oncology Patient Advocate Encounter   Received notification that prior authorization for Abiraterone is required.   PA submitted on 12/28/22 [facility] Status is pending     Jinger Neighbors, CPhT-Adv Oncology Pharmacy Patient Advocate [facility] Direct Number: [phone]  Fax: [phone]

## 2022-12-28 NOTE — Telephone Encounter (Addendum)
Oral Oncology Pharmacist Encounter  Received new prescription for Zytiga (abiraterone) for the treatment of metastatic castration sensitive, high risk, prostate cancer in conjunction with prednisone, ADT, and docetaxel, planned duration until disease progression or unacceptable drug toxicity.  PSA and renal function from 10/09/22 assessed, PSA 162 ng/mL. Patient will have baseline labs done before ADT injection. Prescription dose and frequency assessed for appropriateness.  Current medication list in Epic reviewed, DDIs with Zytiga identified: Category C drug-drug interaction between Zytiga and Pioglitazone - Zytiga may increase risk of decreased glucose with concomitant use together. No changes in therapy warranted at this time. Recommend patient continue monitoring glucose as normal.  Evaluated chart and no patient barriers to medication adherence noted.   Patient agreement for treatment documented in MD note on 12/25/22.  Prescription has been e-scribed to the [facility] for benefits analysis and approval.  Oral Oncology Clinic will continue to follow for insurance authorization, copayment issues, initial counseling and start date.  Lenord Carbo, PharmD, BCPS, [facility] Hematology/Oncology Clinical Pharmacist [facility] Oral Chemotherapy Navigation Clinics [phone] 12/28/2022 8:44 AM

## 2022-12-28 NOTE — Progress Notes (Signed)
RN reached out to IR for port placement availability.  First available is 4.24.  RN left message with [facility] IR to see if there is a date prior to 4/24.  Pending call back.

## 2022-12-29 ENCOUNTER — Telehealth: Payer: Self-pay

## 2022-12-29 ENCOUNTER — Other Ambulatory Visit (HOSPITAL_COMMUNITY): Payer: Self-pay

## 2022-12-29 ENCOUNTER — Other Ambulatory Visit: Payer: Self-pay

## 2022-12-29 ENCOUNTER — Encounter: Payer: Self-pay | Admitting: Hematology

## 2022-12-29 NOTE — Telephone Encounter (Signed)
Oral Oncology Patient Advocate Encounter  Prior Authorization for abiraterone has been approved.    PA# [id number] Effective dates: 12/29/22 through 12/29/23  Patient must fill at [facility].    Jinger Neighbors, CPhT-Adv Oncology Pharmacy Patient Advocate [facility] Direct Number: [phone]  Fax: [phone]

## 2022-12-29 NOTE — Telephone Encounter (Signed)
[id number]: A RANDOMIZED TRIAL ADDRESSING CANCER-RELATED FINANCIAL HARDSHIP THROUGH DELIVERY OF A PROACTIVE FINANCIAL NAVIGATION INTERVENTION (CREDIT)  Called patient to discuss study. Very briefly introduced [id number]. Made plan with patient to see him tomorrow when he comes for appt, and copy of consent will be provided then for him to take home and review. Updated appt notes to add my number for when he arrives.  Margret Chance Ash Mcelwain, RN, BSN, [facility] Clinical Research Nurse [facility] Direct Dial [phone]  Pager [phone] 12/29/2022 12:07 PM

## 2022-12-30 ENCOUNTER — Other Ambulatory Visit: Payer: Self-pay | Admitting: Hematology

## 2022-12-30 ENCOUNTER — Inpatient Hospital Stay: Payer: 59

## 2022-12-30 ENCOUNTER — Other Ambulatory Visit: Payer: Self-pay | Admitting: Internal Medicine

## 2022-12-30 ENCOUNTER — Other Ambulatory Visit: Payer: Self-pay | Admitting: Radiology

## 2022-12-30 VITALS — BP 139/84 | HR 96 | Temp 98.0°F | Resp 20

## 2022-12-30 DIAGNOSIS — C61 Malignant neoplasm of prostate: Secondary | ICD-10-CM

## 2022-12-30 DIAGNOSIS — Z5111 Encounter for antineoplastic chemotherapy: Secondary | ICD-10-CM | POA: Diagnosis not present

## 2022-12-30 MED ORDER — LIDOCAINE-PRILOCAINE 2.5-2.5 % EX CREA: 1.0000 | TOPICAL_CREAM | CUTANEOUS | 2 refills | Status: DC | PRN

## 2022-12-30 MED ORDER — ABIRATERONE ACETATE 250 MG PO TABS
1000.0000 mg | ORAL_TABLET | Freq: Every day | ORAL | 2 refills | Status: DC
Start: 2022-12-30 — End: 2023-03-24

## 2022-12-30 MED ORDER — DEGARELIX ACETATE(240 MG DOSE) 120 MG/VIAL ~~LOC~~ SOLR
240.0000 mg | Freq: Once | SUBCUTANEOUS | Status: AC
  Administered 2022-12-30: 240 mg via SUBCUTANEOUS
  Filled 2022-12-30: qty 6

## 2022-12-30 NOTE — Progress Notes (Signed)
Pharmacist Chemotherapy Monitoring - Initial Assessment    Anticipated start date: 01/06/23   The following has been reviewed per standard work regarding the patient's treatment regimen: The patient's diagnosis, treatment plan and drug doses, and organ/hematologic function Lab orders and baseline tests specific to treatment regimen  The treatment plan start date, drug sequencing, and pre-medications Prior authorization status  Patient's documented medication list, including drug-drug interaction screen and prescriptions for anti-emetics and supportive care specific to the treatment regimen The drug concentrations, fluid compatibility, administration routes, and timing of the medications to be used The patient's access for treatment and lifetime cumulative dose history, if applicable  The patient's medication allergies and previous infusion related reactions, if applicable   Changes made to treatment plan:  N/A  Follow up needed:  Pending authorization for treatment    Ebony Hail, Pharm.D., CPP 12/30/2022@2 :38 PM

## 2022-12-30 NOTE — Research (Signed)
Trial:  [id number]: A RANDOMIZED TRIAL ADDRESSING CANCER-RELATED FINANCIAL HARDSHIP THROUGH DELIVERY OF A PROACTIVE FINANCIAL NAVIGATION INTERVENTION (CREDIT) Patient Timothy Rivera was identified by Dr Mosetta Putt as a potential candidate for the above listed study.  This Clinical Research Nurse met with Timothy Rivera, [id number], on 12/30/22 in a manner and location that ensures patient privacy to discuss participation in the above listed research study.  Patient is Accompanied by his wife .  A copy of the informed consent document and separate HIPAA Authorization was provided to the patient, as well as the spousal consent and HIPAA consent.  Patient reads, speaks, and understands [place].   Patient was provided with the business card of this Nurse and encouraged to contact the research team with any questions.  Approximately 10 minutes were spent with the patient reviewing the informed consent documents.  Patient was provided the option of taking informed consent documents home to review and was encouraged to review at their convenience with their support network, including other care providers. Patient took the consent documents home to review. Plan made with patient for follow up next week at his first chemo appt.  Timothy Chance Lanyiah Brix, RN, BSN, [facility] She  Her  [facility] Clinical Research Nurse [facility] Direct Dial [phone]  Pager [phone] 12/30/2022 4:14 PM

## 2022-12-30 NOTE — Patient Instructions (Signed)
Degarelix Injection What is this medication? DEGARELIX (deg a REL ix) treats prostate cancer. It works by decreasing levels of the hormone testosterone in the body. This prevents prostate cancer cells from spreading or growing. It belongs to a group of medications called GnRH blockers. This medicine may be used for other purposes; ask your health care provider or pharmacist if you have questions. COMMON BRAND NAME(S): Degarelix, Mills Koller What should I tell my care team before I take this medication? They need to know if you have any of these conditions: Diabetes Heart disease Kidney disease Liver disease Low levels of potassium or magnesium in the blood Osteoporosis, weak bones An unusual or allergic reaction to degarelix, mannitol, other medications, foods, dyes, or preservatives If you or your partner are pregnant or trying to get pregnant How should I use this medication? This medication is injected under the skin. It is usually given by your care team in a hospital or clinic setting. It may also be given at home. If you get this medication at home, you will be taught how to prepare and give it. Use exactly as directed. Take it as directed on the prescription label at the same time every day. Keep taking it unless your care team tells you to stop. It is important that you put your used needles and syringes in a special sharps container. Do not put them in a trash can. If you do not have a sharps container, call your pharmacist or care team to get one. Talk to your care team about the use of this medication in children. Special care may be needed. Overdosage: If you think you have taken too much of this medicine contact a poison control center or emergency room at once. NOTE: This medicine is only for you. Do not share this medicine with others. What if I miss a dose? If you get this medication at the hospital or clinic: It is important not to miss your dose. Call your care team if you are  unable to keep an appointment. If you give yourself this medication at home: If you miss a dose, take it as soon as you can. If it is almost time for your next dose, take only that dose. Do not take double or extra doses. Call your care team with questions. What may interact with this medication? Do not take this medication with any of the following: Cisapride Dronedarone Pimozide Thioridazine This medication may also interact with the following: Other medications that cause heart rhythm changes This list may not describe all possible interactions. Give your health care provider a list of all the medicines, herbs, non-prescription drugs, or dietary supplements you use. Also tell them if you smoke, drink alcohol, or use illegal drugs. Some items may interact with your medicine. What should I watch for while using this medication? Your condition will be monitored carefully while you are receiving this medication. You may need blood work while taking this medication. Do not rub or scratch injection site. There may be a lump at the injection site, or it may be red or sore for a few days after your dose. This medication may cause infertility. Talk to your care team if you are concerned about your fertility. What side effects may I notice from receiving this medication? Side effects that you should report to your care team as soon as possible: Allergic reactions or angioedema--skin rash, itching or hives, swelling of the face, eyes, lips, tongue, arms, or legs, trouble swallowing or breathing Heart  rhythm changes--fast or irregular heartbeat, dizziness, feeling faint or lightheaded, chest pain, trouble breathing Side effects that usually do not require medical attention (report to your care team if they continue or are bothersome): Hot flashes Pain, redness, or irritation at injection site Weight gain This list may not describe all possible side effects. Call your doctor for medical advice about  side effects. You may report side effects to FDA at 1-800-FDA-1088. Where should I keep my medication? Keep out of the reach of children and pets. This medication is usually given in a hospital or clinic and will not be stored at home. In rare cases, this medication may be given at home. If you are using this medication at home, you will be instructed on how to store this medication. Get rid of any unused medication after the expiration date. To get rid of medications that are no longer needed or have expired: Take the medication to a medication take-back program. Check with your pharmacy or law enforcement to find a location. If you cannot return the medication, ask your pharmacist or care team how to get rid of this medication safely. NOTE: This sheet is a summary. It may not cover all possible information. If you have questions about this medicine, talk to your doctor, pharmacist, or health care provider.  2023 Elsevier/Gold Standard (2022-01-21 00:00:00)

## 2022-12-30 NOTE — Telephone Encounter (Signed)
Oral Oncology Pharmacist Encounter  Patient's insurance requires them to fill through [employer]. Zytiga prescription has been redirected for dispensing.   Oral Chemotherapy Clinic will follow up with outside pharmacy to ensure Rx is delivered.   Lenord Carbo, PharmD, BCPS, [facility] Hematology/Oncology Clinical Pharmacist [facility] and [facility] Oral Chemotherapy Navigation Clinics [phone] 12/30/2022 7:58 AM

## 2022-12-31 ENCOUNTER — Other Ambulatory Visit: Payer: Self-pay

## 2022-12-31 ENCOUNTER — Encounter (HOSPITAL_COMMUNITY): Payer: Self-pay

## 2022-12-31 ENCOUNTER — Ambulatory Visit (HOSPITAL_COMMUNITY)
Admission: RE | Admit: 2022-12-31 | Discharge: 2022-12-31 | Disposition: A | Discharge status: Home / Self Care | Payer: 59 | Source: Ambulatory Visit | Attending: Hematology | Admitting: Hematology

## 2022-12-31 DIAGNOSIS — Z801 Family history of malignant neoplasm of trachea, bronchus and lung: Secondary | ICD-10-CM | POA: Insufficient documentation

## 2022-12-31 DIAGNOSIS — C61 Malignant neoplasm of prostate: Secondary | ICD-10-CM | POA: Diagnosis present

## 2022-12-31 DIAGNOSIS — C7951 Secondary malignant neoplasm of bone: Secondary | ICD-10-CM | POA: Diagnosis not present

## 2022-12-31 HISTORY — PX: IR IMAGING GUIDED PORT INSERTION: IMG5740

## 2022-12-31 LAB — GLUCOSE, CAPILLARY: Glucose-Capillary: 123 mg/dL — ABNORMAL HIGH (ref 70–99)

## 2022-12-31 MED ORDER — FENTANYL CITRATE (PF) 100 MCG/2ML IJ SOLN
INTRAMUSCULAR | Status: AC | PRN
  Administered 2022-12-31 (×2): 25 ug via INTRAVENOUS

## 2022-12-31 MED ORDER — MIDAZOLAM HCL 2 MG/2ML IJ SOLN
INTRAMUSCULAR | Status: AC
  Filled 2022-12-31: qty 2

## 2022-12-31 MED ORDER — MIDAZOLAM HCL 2 MG/2ML IJ SOLN
INTRAMUSCULAR | Status: AC | PRN
  Administered 2022-12-31: 1 mg via INTRAVENOUS

## 2022-12-31 MED ORDER — SODIUM CHLORIDE 0.9 % IV SOLN: INTRAVENOUS | Status: DC

## 2022-12-31 MED ORDER — HEPARIN SOD (PORK) LOCK FLUSH 100 UNIT/ML IV SOLN
INTRAVENOUS | Status: AC
  Filled 2022-12-31: qty 5

## 2022-12-31 MED ORDER — FENTANYL CITRATE (PF) 100 MCG/2ML IJ SOLN
INTRAMUSCULAR | Status: AC
  Filled 2022-12-31: qty 2

## 2022-12-31 MED ORDER — LIDOCAINE-EPINEPHRINE 1 %-1:100000 IJ SOLN
INTRAMUSCULAR | Status: AC
  Filled 2022-12-31: qty 1

## 2022-12-31 NOTE — Procedures (Signed)
Interventional Radiology Procedure Note ° °Procedure: Single Lumen Power Port Placement   ° °Access:  Right internal jugular vein ° °Findings: Catheter tip positioned at cavoatrial junction. Port is ready for immediate use.  ° °Complications: None ° °EBL: < 10 mL ° °Recommendations:  °- Ok to shower in 24 hours °- Do not submerge for 7 days °- Routine line care  ° ° °Avrohom Mckelvin, MD ° ° ° °

## 2022-12-31 NOTE — H&P (Addendum)
Chief Complaint: Patient was seen in consultation today for [facility] a cath placement at the request of [name],[name]  Referring Physician(s): [name],[name]  Supervising Physician: Gilmer Mor  Patient Status: [facility] - Out-pt  History of Present Illness: Timothy Rivera is a 42 y.o. male   Prostate cancer March 2024 - followed by Dr Mosetta Putt  PET:  12/15/22:  IMPRESSION: 1. Prostatic primary or primaries with nodal metastasis within the chest, abdomen, and pelvis. 2. The hepatic lesion on prior diagnostic CT is not tracer avid, arguing against metastatic disease. Likely an incidental benign lesion. 3. Widespread osseous metastasis.  Scheduled today for Port a cath placement per Oncology To start Chemo therapy 01/06/23    Past Medical History:  Diagnosis Date   Azoospermia non-obstructive 2021   Diabetes mellitus without complication    Elevated PSA 11/05/2022   10/09/2022   Gross hematuria 11/12/2022   11/05/2022, 10/09/2022   Torn Achilles tendon     Past Surgical History:  Procedure Laterality Date   LAPAROSCOPIC INGUINAL HERNIA REPAIR  1991   PROSTATE BIOPSY      Allergies: Patient has no known allergies.  Medications: Prior to Admission medications   Medication Sig Start Date End Date Taking? Authorizing Provider  abiraterone acetate (ZYTIGA) 250 MG tablet Take 4 tablets (1,000 mg total) by mouth daily. Take on an empty stomach 1 hour before or 2 hours after a meal 12/30/22   Malachy Mood, MD  Accu-Chek FastClix Lancets MISC as directed for 90 days 12/02/22   [provider]  Blood Glucose Monitoring Suppl (ACCU-CHEK AVIVA PLUS) w/Device KIT as directed for 90 days 09/11/22   [provider]  Glucose Blood (BLOOD GLUCOSE TEST STRIPS 333) STRP as directed In Vitro once a day for 90 days 09/11/22   [provider]  lidocaine-prilocaine (EMLA) cream Apply 1 Application topically as needed. To port area before port access 12/30/22   Malachy Mood, MD  metFORMIN  (GLUCOPHAGE) 1000 MG tablet Take 1,000 mg by mouth 2 (two) times daily with a meal. 09/11/22   [provider]  milk thistle 175 MG tablet Take 175 mg by mouth daily.    [provider]  ondansetron (ZOFRAN) 8 MG tablet Take 1 tablet (8 mg total) by mouth every 8 (eight) hours as needed for nausea or vomiting. 12/27/22   Malachy Mood, MD  pioglitazone (ACTOS) 30 MG tablet 1 tablet Orally Once a day for 90 days 10/23/22   [provider]  predniSONE (DELTASONE) 5 MG tablet Take 1 tablet (5 mg total) by mouth daily with breakfast. 12/27/22   Malachy Mood, MD  prochlorperazine (COMPAZINE) 10 MG tablet Take 1 tablet (10 mg total) by mouth every 6 (six) hours as needed for nausea or vomiting. 12/27/22   Malachy Mood, MD     Family History  Problem Relation Age of Onset   Cancer Maternal Grandmother 54       unknown type   Lung cancer Other        heavy smoker    Social History   Socioeconomic History   Marital status: Married    Spouse name: Not on file   Number of children: Not on file   Years of education: Not on file   Highest education level: Not on file  Occupational History   Not on file  Tobacco Use   Smoking status: Never   Smokeless tobacco: Never  Vaping Use   Vaping Use: Never used  Substance and Sexual Activity  Alcohol use: Yes    Comment: socially   Drug use: Never   Sexual activity: Yes  Other Topics Concern   Not on file  Social History Narrative   Not on file   Social Determinants of Health   Financial Resource Strain: Not on file  Food Insecurity: Not on file  Transportation Needs: Not on file  Physical Activity: Not on file  Stress: Not on file  Social Connections: Not on file    Review of Systems: A 12 point ROS discussed and pertinent positives are indicated in the HPI above.  All other systems are negative.  Review of Systems  Constitutional:  Negative for activity change, fatigue, fever and unexpected weight change.  Respiratory:   Negative for cough and shortness of breath.   Cardiovascular:  Negative for chest pain.  Gastrointestinal:  Negative for abdominal pain.  Musculoskeletal:  Positive for back pain. Negative for gait problem.  Neurological:  Negative for weakness.  Psychiatric/Behavioral:  Negative for behavioral problems and confusion.     Vital Signs: BP (!) 136/94   Pulse 97   Temp 98 F (36.7 C) (Oral)   Resp 16   Ht 6\' 2"  (1.88 m)   Wt 262 lb (118.8 kg)   SpO2 95%   BMI 33.64 kg/m     Physical Exam Vitals reviewed.  HENT:     Mouth/Throat:     Mouth: Mucous membranes are moist.  Cardiovascular:     Rate and Rhythm: Normal rate and regular rhythm.     Heart sounds: Normal heart sounds.  Pulmonary:     Effort: Pulmonary effort is normal.     Breath sounds: Normal breath sounds. No wheezing.  Abdominal:     Palpations: Abdomen is soft.  Musculoskeletal:        General: No swelling or tenderness. Normal range of motion.  Skin:    General: Skin is warm.  Neurological:     Mental Status: He is alert and oriented to person, place, and time.  Psychiatric:        Behavior: Behavior normal.     Imaging: NM PET (PSMA) SKULL TO MID THIGH  Result Date: 12/16/2022 CLINICAL DATA:  Prostate cancer diagnosed 11/19/2022.  PSA of 162. EXAM: NUCLEAR MEDICINE PET SKULL BASE TO THIGH TECHNIQUE: 9.1 mCi F18 Piflufolastat (Pylarify) was injected intravenously. Full-ring PET imaging was performed from the skull base to thigh after the radiotracer. CT data was obtained and used for attenuation correction and anatomic localization. COMPARISON:  11/12/2022 abdominopelvic CT from [facility]. FINDINGS: NECK No radiotracer activity in neck lymph nodes. Incidental CT finding: No cervical adenopathy. CHEST No pulmonary parenchymal hypermetabolism. A prevascular node measures 1.6 cm and a S.U.V. max of 4.2 on 88/4. Incidental CT finding: None. ABDOMEN/PELVIS Prostate: Multifocal prostatic tracer affinity.  Example posterolateral right base at a S.U.V. max of 12.9. At the apex, bilaterally at a S.U.V. max of 8.8. Lymph nodes: Abdominal retroperitoneal tracer avid nodes. Example low left periaortic node of 1.5 cm and a S.U.V. max of 5.0 on 171/4. Bilateral pelvic sidewall tracer avid nodes. Example left external iliac 1.6 cm and a S.U.V. max of 7.7 on 198/4. Right internal iliac node measures 1.8 cm and a S.U.V. max of 7.3 on 200/4. Liver: The posterior right hepatic lobe hypoattenuating lesion is not tracer avid, including at 2.8 cm on 128/4. Incidental CT finding: Deferred to recent diagnostic CT. SKELETON Multifocal tracer avid osseous metastasis. Example right side of the sacrum at a S.U.V.  max of 17.0. Within the anterior L4 vertebral body at a S.U.V. max of 10.8. IMPRESSION: 1. Prostatic primary or primaries with nodal metastasis within the chest, abdomen, and pelvis. 2. The hepatic lesion on prior diagnostic CT is not tracer avid, arguing against metastatic disease. Likely an incidental benign lesion. 3. Widespread osseous metastasis. Electronically Signed   By: Jeronimo Greaves M.D.   On: 12/16/2022 10:19    Labs:  CBC: No results for input(s): "WBC", "HGB", "HCT", "PLT" in the last 8760 hours.  COAGS: No results for input(s): "INR", "APTT" in the last 8760 hours.  BMP: No results for input(s): "NA", "K", "CL", "CO2", "GLUCOSE", "BUN", "CALCIUM", "CREATININE", "GFRNONAA", "GFRAA" in the last 8760 hours.  Invalid input(s): "CMP"  LIVER FUNCTION TESTS: No results for input(s): "BILITOT", "AST", "ALT", "ALKPHOS", "PROT", "ALBUMIN" in the last 8760 hours.  TUMOR MARKERS: No results for input(s): "AFPTM", "CEA", "CA199", "CHROMGRNA" in the last 8760 hours.  Assessment and Plan:  Prostate cancer Diffuse nodal and bony metastasis Scheduled to start chemo therapy 01/06/23 Scheduled for Port a cath placement in IR Risks and benefits of image guided port-a-catheter placement was discussed with the  patient including, but not limited to bleeding, infection, pneumothorax, or fibrin sheath development and need for additional procedures.  All of the patient's questions were answered, patient is agreeable to proceed. Consent signed and in chart.  Thank you for this interesting consult.  I greatly enjoyed meeting Hawk Mones and look forward to participating in their care.  A copy of this report was sent to the requesting provider on this date.  Electronically Signed: Robet Leu, PA-C 12/31/2022, 8:35 AM   I spent a total of  30 Minutes   in face to face in clinical consultation, greater than 50% of which was counseling/coordinating care for [facility] placement

## 2023-01-01 MED ORDER — PREDNISONE 5 MG PO TABS: 5.0000 mg | ORAL_TABLET | Freq: Every day | ORAL | 3 refills | Status: DC

## 2023-01-01 NOTE — Telephone Encounter (Signed)
Oral Chemotherapy Pharmacist Encounter  I spoke with patient for overview of: Zytiga (abiraterone) for the treatment of metastatic castration sensitive, high risk, prostate cancer in conjunction with prednisone, ADT, and docetaxel, planned duration until disease progression or unacceptable drug toxicity.   Counseled patient on administration, dosing, side effects, monitoring, drug-food interactions, safe handling, storage, and disposal.  Patient will take Zytiga  tablets, 4 tablets ( ) by mouth once daily on an empty stomach, 1 hour before or 2 hours after a meal.  Patient states he will take his Zytiga in the morning and will wait at least 1 hour before eating.  Patient will take prednisone  tablet, 1 tablet by mouth one daily with breakfast. Patient will pick this up from [facility] today.  Zytiga start date: 01/02/23 (OK for patient to start prior to labs/docetaxel on 4/24 per Dr. Mosetta Putt)  Adverse effects include but are not limited to: peripheral edema, GI upset, hypertension, hot flashes, fatigue, and arthralgias.    Reviewed with patient importance of keeping a medication schedule and plan for any missed doses. No barriers to medication adherence identified.  Medication reconciliation performed and medication/allergy list updated.  All questions answered.  Mr. Reiley voiced understanding and appreciation.   Medication education handout placed in mail for patient. Patient knows to call the office with questions or concerns. Oral Chemotherapy Clinic phone number provided to patient.   Lenord Carbo, PharmD, BCPS, BCOP Hematology/Oncology Clinical Pharmacist [facility] Oral Chemotherapy Navigation Clinics [phone] 01/01/2023 2:02 PM

## 2023-01-04 ENCOUNTER — Encounter: Payer: Self-pay | Admitting: Genetic Counselor

## 2023-01-04 ENCOUNTER — Other Ambulatory Visit: Payer: Self-pay | Admitting: Nurse Practitioner

## 2023-01-04 ENCOUNTER — Telehealth: Payer: Self-pay | Admitting: Genetic Counselor

## 2023-01-04 DIAGNOSIS — Z1379 Encounter for other screening for genetic and chromosomal anomalies: Secondary | ICD-10-CM | POA: Insufficient documentation

## 2023-01-04 NOTE — Telephone Encounter (Signed)
I contacted Mr. Dollard to discuss his genetic testing results. No pathogenic variants were identified in the 43 genes analyzed. Detailed clinic note to follow.  The test report has been scanned into EPIC and is located under the Molecular Pathology section of the Results Review tab.  A portion of the result report is included below for reference.   Lalla Brothers, MS, [facility] Genetic Counselor [email or web address] (P) [phone]

## 2023-01-05 ENCOUNTER — Other Ambulatory Visit: Payer: Self-pay | Admitting: Nurse Practitioner

## 2023-01-05 ENCOUNTER — Other Ambulatory Visit: Payer: Self-pay

## 2023-01-05 MED FILL — Dexamethasone Sodium Phosphate Inj 100 MG/10ML: INTRAMUSCULAR | Qty: 1 | Status: AC

## 2023-01-05 NOTE — Assessment & Plan Note (Signed)
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 -Unfortunately the PSMA scan showed diffuse bone metastasis, in spine, pelvic, ribs, humerus and femurs, he also has diffuse hypermetabolic adenopathy in thoracic, abdomen and pelvis.  The liver lesion was not hypermetabolic, so unlikely related to his prostate cancer. --Due to the high disease burden, I recommend triple therapy with androgen deprivation, Zytiga and prednisone, and chemotherapy docetaxel every 3 weeks for 6 cycles.  -he has received first ADT Firmagon on 12/30/22, and started Zytiga and prednisone on 01/02/2023 -he is scheduled to start chemotherapy docetaxel today, I reviewed the potential side effect in the management with him in detail, he agrees to proceed.

## 2023-01-06 ENCOUNTER — Inpatient Hospital Stay: Payer: 59

## 2023-01-06 ENCOUNTER — Other Ambulatory Visit: Payer: 59

## 2023-01-06 ENCOUNTER — Encounter: Payer: Self-pay | Admitting: Hematology

## 2023-01-06 ENCOUNTER — Inpatient Hospital Stay (HOSPITAL_BASED_OUTPATIENT_CLINIC_OR_DEPARTMENT_OTHER): Payer: 59 | Admitting: Hematology

## 2023-01-06 VITALS — BP 142/93 | HR 85 | Temp 98.2°F | Resp 18 | Ht 74.0 in | Wt 268.1 lb

## 2023-01-06 VITALS — BP 132/78 | HR 78 | Temp 98.2°F | Resp 18

## 2023-01-06 DIAGNOSIS — C61 Malignant neoplasm of prostate: Secondary | ICD-10-CM

## 2023-01-06 DIAGNOSIS — Z5111 Encounter for antineoplastic chemotherapy: Secondary | ICD-10-CM | POA: Diagnosis not present

## 2023-01-06 LAB — CBC WITH DIFFERENTIAL (CANCER CENTER ONLY)
Abs Immature Granulocytes: 0.08 10*3/uL — ABNORMAL HIGH (ref 0.00–0.07)
Basophils Absolute: 0.1 10*3/uL (ref 0.0–0.1)
Basophils Relative: 1 %
Eosinophils Absolute: 0.2 10*3/uL (ref 0.0–0.5)
Eosinophils Relative: 3 %
HCT: 34 % — ABNORMAL LOW (ref 39.0–52.0)
Hemoglobin: 11.4 g/dL — ABNORMAL LOW (ref 13.0–17.0)
Immature Granulocytes: 1 %
Lymphocytes Relative: 28 %
Lymphs Abs: 1.8 10*3/uL (ref 0.7–4.0)
MCH: 25.4 pg — ABNORMAL LOW (ref 26.0–34.0)
MCHC: 33.5 g/dL (ref 30.0–36.0)
MCV: 75.7 fL — ABNORMAL LOW (ref 80.0–100.0)
Monocytes Absolute: 0.9 10*3/uL (ref 0.1–1.0)
Monocytes Relative: 13 %
Neutro Abs: 3.5 10*3/uL (ref 1.7–7.7)
Neutrophils Relative %: 54 %
Platelet Count: 211 10*3/uL (ref 150–400)
RBC: 4.49 MIL/uL (ref 4.22–5.81)
RDW: 14.3 % (ref 11.5–15.5)
WBC Count: 6.5 10*3/uL (ref 4.0–10.5)
nRBC: 0 % (ref 0.0–0.2)

## 2023-01-06 LAB — CMP (CANCER CENTER ONLY)
ALT: 9 U/L (ref 0–44)
AST: 10 U/L — ABNORMAL LOW (ref 15–41)
Albumin: 4.3 g/dL (ref 3.5–5.0)
Alkaline Phosphatase: 663 U/L — ABNORMAL HIGH (ref 38–126)
Anion gap: 7 (ref 5–15)
BUN: 18 mg/dL (ref 6–20)
CO2: 28 mmol/L (ref 22–32)
Calcium: 9 mg/dL (ref 8.9–10.3)
Chloride: 104 mmol/L (ref 98–111)
Creatinine: 1.19 mg/dL (ref 0.61–1.24)
GFR, Estimated: >60 mL/min (ref >60)
Glucose, Bld: 139 mg/dL — ABNORMAL HIGH (ref 70–99)
Potassium: 4.1 mmol/L (ref 3.5–5.1)
Sodium: 139 mmol/L (ref 135–145)
Total Bilirubin: 0.4 mg/dL (ref 0.3–1.2)
Total Protein: 7.1 g/dL (ref 6.5–8.1)

## 2023-01-06 MED ORDER — SODIUM CHLORIDE 0.9 % IV SOLN: Freq: Once | INTRAVENOUS | Status: AC

## 2023-01-06 MED ORDER — DEXAMETHASONE 4 MG PO TABS
ORAL_TABLET | ORAL | 1 refills | Status: DC
Start: 2023-01-06 — End: 2023-03-10

## 2023-01-06 MED ORDER — SODIUM CHLORIDE 0.9 % IV SOLN
75.0000 mg/m2 | Freq: Once | INTRAVENOUS | Status: AC
  Administered 2023-01-06: 189 mg via INTRAVENOUS
  Filled 2023-01-06: qty 18.9

## 2023-01-06 MED ORDER — LIDOCAINE-PRILOCAINE 2.5-2.5 % EX CREA
TOPICAL_CREAM | CUTANEOUS | 3 refills | Status: DC
Start: 2023-01-06 — End: 2023-10-06

## 2023-01-06 MED ORDER — PALONOSETRON HCL INJECTION 0.25 MG/5ML
0.2500 mg | Freq: Once | INTRAVENOUS | Status: AC
  Administered 2023-01-06: 0.25 mg via INTRAVENOUS
  Filled 2023-01-06: qty 5

## 2023-01-06 MED ORDER — SODIUM CHLORIDE 0.9% FLUSH
10.0000 mL | INTRAVENOUS | Status: DC | PRN
  Administered 2023-01-06: 10 mL via INTRAVENOUS

## 2023-01-06 MED ORDER — SODIUM CHLORIDE 0.9 % IV SOLN
10.0000 mg | Freq: Once | INTRAVENOUS | Status: AC
  Administered 2023-01-06: 10 mg via INTRAVENOUS
  Filled 2023-01-06: qty 10

## 2023-01-06 NOTE — Patient Instructions (Signed)
[facility]  Discharge Instructions: Thank you for choosing [facility] to provide your oncology and hematology care.   If you have a lab appointment with the Cancer Center, please go directly to the Cancer Center and check in at the registration area.   Wear comfortable clothing and clothing appropriate for easy access to any Portacath or PICC line.   We strive to give you quality time with your provider. You may need to reschedule your appointment if you arrive late (15 or more minutes).  Arriving late affects you and other patients whose appointments are after yours.  Also, if you miss three or more appointments without notifying the office, you may be dismissed from the clinic at the provider's discretion.      For prescription refill requests, have your pharmacy contact our office and allow 72 hours for refills to be completed.    Today you received the following chemotherapy and/or immunotherapy agents Docetaxel.   To help prevent nausea and vomiting after your treatment, we encourage you to take your nausea medication as directed.  BELOW ARE SYMPTOMS THAT SHOULD BE REPORTED IMMEDIATELY: *FEVER GREATER THAN 100.4 F (38 C) OR HIGHER *CHILLS OR SWEATING *NAUSEA AND VOMITING THAT IS NOT CONTROLLED WITH YOUR NAUSEA MEDICATION *UNUSUAL SHORTNESS OF BREATH *UNUSUAL BRUISING OR BLEEDING *URINARY PROBLEMS (pain or burning when urinating, or frequent urination) *BOWEL PROBLEMS (unusual diarrhea, constipation, pain near the anus) TENDERNESS IN MOUTH AND THROAT WITH OR WITHOUT PRESENCE OF ULCERS (sore throat, sores in mouth, or a toothache) UNUSUAL RASH, SWELLING OR PAIN  UNUSUAL VAGINAL DISCHARGE OR ITCHING   Items with * indicate a potential emergency and should be followed up as soon as possible or go to the Emergency Department if any problems should occur.  Please show the CHEMOTHERAPY ALERT CARD or IMMUNOTHERAPY ALERT CARD at check-in  to the Emergency Department and triage nurse.  Should you have questions after your visit or need to cancel or reschedule your appointment, please contact [facility]  Dept: [phone]  and follow the prompts.  Office hours are 8:00 a.m. to 4:30 p.m. Monday - Friday. Please note that voicemails left after 4:00 p.m. may not be returned until the following business day.  We are closed weekends and major holidays. You have access to a nurse at all times for urgent questions. Please call the main number to the clinic Dept: [phone] and follow the prompts.   For any non-urgent questions, you may also contact your provider using MyChart. We now offer e-Visits for anyone 18 and older to request care online for non-urgent symptoms. For details visit [email or web address].   Also download the MyChart app! Go to the app store, search "MyChart", open the app, select , and log in with your MyChart username and password.   

## 2023-01-06 NOTE — Progress Notes (Signed)
[facility]   Telephone:(336) [phone] Fax:(336) [phone]   Clinic Follow up Note   Patient Care Team: Orpha Bur, MD as PCP - General (Family Medicine) Malachy Mood, MD as Consulting Physician (Hematology and Oncology) Despina Arias, MD as Referring Physician (Urology) Christena Deem, MD as Consulting Physician (Sports Medicine) Bjorn Pippin, MD as Consulting Physician (Orthopedic Surgery)  Date of Service:  01/06/2023  CHIEF COMPLAINT: f/u of Prostate Cancer   CURRENT THERAPY:  Prostate Docetaxel q21d    ASSESSMENT:  Timothy Rivera is a 42 y.o. 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 -Unfortunately the PSMA scan showed diffuse bone metastasis, in spine, pelvic, ribs, humerus and femurs, he also has diffuse hypermetabolic adenopathy in thoracic, abdomen and pelvis.  The liver lesion was not hypermetabolic, so unlikely related to his prostate cancer. --Due to the high disease burden, I recommend triple therapy with androgen deprivation, Zytiga and prednisone, and chemotherapy docetaxel every 3 weeks for 6 cycles.  -he has received first ADT Firmagon on 12/30/22, and started Zytiga and prednisone on 01/02/2023 -he is scheduled to start chemotherapy docetaxel today, I reviewed the potential side effect in the management with him in detail, he agrees to proceed.    PLAN: -lab  -Discuss Injection to boost  WBC count -Discuss docetaxel and side effects -proceed with D1 ,C1 docetaxel -lab/flush and f/u NP Lacie -  SUMMARY OF ONCOLOGIC HISTORY: Oncology History Overview Note   Cancer Staging  Malignant neoplasm of prostate Staging form: Prostate, AJCC 8th Edition - Clinical stage from 11/05/2022: Stage IVB (cT1c, cN1, pM1c,  PSA: 162, Grade Group: 5) - Signed by Marcello Fennel, PA-C on 12/24/2022 Histopathologic type: Adenocarcinoma, NOS Stage prefix: Initial diagnosis Prostate specific antigen (PSA) range: 20 or greater Gleason primary pattern: 4 Gleason secondary pattern: 5 Gleason score: 9 Histologic grading system: 5 grade system Number of biopsy cores examined: 12 Number of biopsy cores positive: 12     Malignant neoplasm of prostate  11/05/2022 Cancer Staging   Staging form: Prostate, AJCC 8th Edition - Clinical stage from 11/05/2022: Stage IVB (cT1c, cN1, pM1c, PSA: 162, Grade Group: 5) - Signed by Marcello Fennel, PA-C on 12/24/2022 Histopathologic type: Adenocarcinoma, NOS Stage prefix: Initial diagnosis Prostate specific antigen (PSA) range: 20 or greater Gleason primary pattern: 4 Gleason secondary pattern: 5 Gleason score: 9 Histologic grading system: 5 grade system Number of biopsy cores examined: 12 Number of biopsy cores positive: 12   12/15/2022 PET scan    IMPRESSION: 1. Prostatic primary or primaries with nodal metastasis within the chest, abdomen, and pelvis. 2. The hepatic lesion on prior diagnostic CT is not tracer avid, arguing against metastatic disease. Likely an incidental benign lesion. 3. Widespread osseous metastasis.     12/24/2022 Initial Diagnosis   Malignant neoplasm of prostate   01/06/2023 -  Chemotherapy   Patient is on Treatment Plan : PROSTATE Docetaxel (75) q21d        INTERVAL HISTORY:  Timothy Rivera is here for a follow up of Prostate Cancer . He was last seen by me on 12/25/2022. He presents to the clinic accompanied by wife and mother. Pt stated that he has had some hot flashes.    All other systems were reviewed with the patient and are  negative.  MEDICAL HISTORY:  Past Medical History:  Diagnosis Date   Azoospermia non-obstructive 2021   Diabetes mellitus without complication    Elevated PSA 11/05/2022   10/09/2022   Gross hematuria  11/12/2022   11/05/2022, 10/09/2022   Torn Achilles tendon     SURGICAL HISTORY: Past Surgical History:  Procedure Laterality Date   IR IMAGING GUIDED PORT INSERTION  12/31/2022   LAPAROSCOPIC INGUINAL HERNIA REPAIR  1991   PROSTATE BIOPSY      I have reviewed the social history and family history with the patient and they are unchanged from previous note.  ALLERGIES:  has No Known Allergies.  MEDICATIONS:  Current Outpatient Medications  Medication Sig Dispense Refill   abiraterone acetate (ZYTIGA) 250 MG tablet Take 4 tablets (1,000 mg total) by mouth daily. Take on an empty stomach 1 hour before or 2 hours after a meal 120 tablet 2   Accu-Chek FastClix Lancets MISC as directed for 90 days     Blood Glucose Monitoring Suppl (ACCU-CHEK AVIVA PLUS) w/Device KIT as directed for 90 days     dexamethasone (DECADRON) 4 MG tablet Take 2 tabs by mouth 2 times daily starting day before chemo. Then take 2 tabs daily for 2 days starting day after chemo. Take with food. 30 tablet 1   Glucose Blood (BLOOD GLUCOSE TEST STRIPS 333) STRP as directed In Vitro once a day for 90 days     lidocaine-prilocaine (EMLA) cream Apply 1 Application topically as needed. To port area before port access 30 g 2   lidocaine-prilocaine (EMLA) cream Apply to affected area once 30 g 3   metFORMIN (GLUCOPHAGE) 1000 MG tablet Take 1,000 mg by mouth 2 (two) times daily with a meal.     milk thistle 175 MG tablet Take 175 mg by mouth daily.     ondansetron (ZOFRAN) 8 MG tablet Take 1 tablet (8 mg total) by mouth every 8 (eight) hours as needed for nausea or vomiting. 30 tablet 1   pioglitazone (ACTOS) 30 MG tablet 1 tablet Orally Once a day for 90 days     predniSONE (DELTASONE) 5 MG tablet Take 1 tablet (5 mg total) by mouth daily with breakfast. 30 tablet 3   prochlorperazine (COMPAZINE) 10 MG tablet Take 1 tablet (10 mg total) by mouth every 6 (six) hours as needed for nausea or vomiting. 30 tablet 1   No current  facility-administered medications for this visit.   Facility-Administered Medications Ordered in Other Visits  Medication Dose Route Frequency Provider Last Rate Last Admin   DOCEtaxel (TAXOTERE) 189 mg in sodium chloride 0.9 % 500 mL chemo infusion  75 mg/m2 (Treatment Plan Recorded) Intravenous Once Malachy Mood, MD 519 mL/hr at 01/06/23 1108 189 mg at 01/06/23 1108    PHYSICAL EXAMINATION: ECOG PERFORMANCE STATUS: 0 - Asymptomatic  Vitals:   01/06/23 0851  BP: (!) 142/93  Pulse: 85  Resp: 18  Temp: 98.2 F (36.8 C)  SpO2: 100%   Wt Readings from Last 3 Encounters:  01/06/23 268 lb 1.6 oz (121.6 kg)  12/31/22 262 lb (118.8 kg)  12/25/22 268 lb (121.6 kg)    GENERAL:alert, no distress and comfortable SKIN: skin color normal, no rashes or significant lesions EYES: normal, Conjunctiva are pink and non-injected, sclera clear  NEURO: alert & oriented x 3 with fluent speech    LABORATORY DATA:  I have reviewed the data as listed    Latest Ref Rng & Units 01/06/2023    8:17  AM  CBC  WBC 4.0 - 10.5 K/uL 6.5   Hemoglobin 13.0 - 17.0 g/dL 16.1   Hematocrit 09.6 - 52.0 % 34.0   Platelets 150 - 400 K/uL 211         Latest Ref Rng & Units 01/06/2023    8:17 AM  CMP  Glucose 70 - 99 mg/dL 045   BUN 6 - 20 mg/dL 18   Creatinine 4.09 - 1.24 mg/dL 8.11   Sodium 914 - 782 mmol/L 139   Potassium 3.5 - 5.1 mmol/L 4.1   Chloride 98 - 111 mmol/L 104   CO2 22 - 32 mmol/L 28   Calcium 8.9 - 10.3 mg/dL 9.0   Total Protein 6.5 - 8.1 g/dL 7.1   Total Bilirubin 0.3 - 1.2 mg/dL 0.4   Alkaline Phos 38 - 126 U/L 663   AST 15 - 41 U/L 10   ALT 0 - 44 U/L 9       RADIOGRAPHIC STUDIES: I have personally reviewed the radiological images as listed and agreed with the findings in the report. No results found.    No orders of the defined types were placed in this encounter.  All questions were answered. The patient knows to call the clinic with any problems, questions or concerns. No  barriers to learning was detected. The total time spent in the appointment was 30 minutes.     Malachy Mood, MD 01/06/2023   Carolin Coy, CMA, am acting as scribe for Malachy Mood, MD.   I have reviewed the above documentation for accuracy and completeness, and I agree with the above.

## 2023-01-07 ENCOUNTER — Encounter: Payer: Self-pay | Admitting: Genetic Counselor

## 2023-01-07 ENCOUNTER — Telehealth: Payer: Self-pay | Admitting: *Deleted

## 2023-01-07 ENCOUNTER — Telehealth: Payer: Self-pay | Admitting: Hematology

## 2023-01-07 NOTE — Telephone Encounter (Signed)
Contacted patient to scheduled appointments. Patient is aware of appointments that are scheduled.   

## 2023-01-07 NOTE — Telephone Encounter (Signed)
-----   Message from Jason A Eubanks, RN sent at 01/06/2023 12:37 PM EDT ----- Regarding: first time treatment call back- [name]- Docetaxel Patient received treatment today for the first time. He is followed by Dr. [name]. He received Taxotere today. Tolerated it well with no issues :)  

## 2023-01-07 NOTE — Telephone Encounter (Signed)
-----   Message from Coralee Rud, RN sent at 01/06/2023 12:37 PM EDT ----- Regarding: first time treatment call back- Mosetta Putt- Docetaxel Patient received treatment today for the first time. He is followed by Dr. Mosetta Putt. He received Taxotere today. Tolerated it well with no issues :)

## 2023-01-08 ENCOUNTER — Inpatient Hospital Stay: Payer: 59

## 2023-01-11 ENCOUNTER — Ambulatory Visit: Payer: Self-pay | Admitting: Genetic Counselor

## 2023-01-11 ENCOUNTER — Other Ambulatory Visit: Payer: Self-pay

## 2023-01-11 DIAGNOSIS — Z1379 Encounter for other screening for genetic and chromosomal anomalies: Secondary | ICD-10-CM

## 2023-01-11 DIAGNOSIS — C61 Malignant neoplasm of prostate: Secondary | ICD-10-CM

## 2023-01-11 NOTE — Progress Notes (Signed)
HPI:   Mr. Timothy Rivera was previously seen in the [facility] due to a personal and family history of cancer and concerns regarding a hereditary predisposition to cancer. Please refer to our prior cancer genetics clinic note for more information regarding our discussion, assessment and recommendations, at the time. Mr. Parkison recent genetic test results were disclosed to him, as were recommendations warranted by these results. These results and recommendations are discussed in more detail below.  CANCER HISTORY:  Oncology History Overview Note   Cancer Staging  Malignant neoplasm of prostate Staging form: Prostate, AJCC 8th Edition - Clinical stage from 11/05/2022: Stage IVB (cT1c, cN1, pM1c, PSA: 162, Grade Group: 5) - Signed by Marcello Fennel, PA-C on 12/24/2022 Histopathologic type: Adenocarcinoma, NOS Stage prefix: Initial diagnosis Prostate specific antigen (PSA) range: 20 or greater Gleason primary pattern: 4 Gleason secondary pattern: 5 Gleason score: 9 Histologic grading system: 5 grade system Number of biopsy cores examined: 12 Number of biopsy cores positive: 12     Malignant neoplasm of prostate (HCC)  11/05/2022 Cancer Staging   Staging form: Prostate, AJCC 8th Edition - Clinical stage from 11/05/2022: Stage IVB (cT1c, cN1, pM1c, PSA: 162, Grade Group: 5) - Signed by Marcello Fennel, PA-C on 12/24/2022 Histopathologic type: Adenocarcinoma, NOS Stage prefix: Initial diagnosis Prostate specific antigen (PSA) range: 20 or greater Gleason primary pattern: 4 Gleason secondary pattern: 5 Gleason score: 9 Histologic grading system: 5 grade system Number of biopsy cores examined: 12 Number of biopsy cores positive: 12   12/15/2022 PET scan    IMPRESSION: 1. Prostatic primary or primaries with nodal metastasis within the chest, abdomen, and pelvis. 2. The hepatic lesion on prior diagnostic CT is not tracer avid, arguing against metastatic disease. Likely an  incidental benign lesion. 3. Widespread osseous metastasis.     12/24/2022 Initial Diagnosis   Malignant neoplasm of prostate   01/06/2023 -  Chemotherapy   Patient is on Treatment Plan : PROSTATE Docetaxel (75) q21d       FAMILY HISTORY:  We obtained a detailed, 4-generation family history.  Significant diagnoses are listed below: Family History  Problem Relation Age of Onset   Cancer Maternal Grandmother 19       unknown type   Lung cancer Other        heavy smoker   Mr. Bollard is unaware of previous family history of genetic testing for hereditary cancer risks and there is no reported Ashkenazi Jewish ancestry.         GENETIC TEST RESULTS:  The [facility] Custom Cancer Panel found no pathogenic mutations.   The Custom Hereditary Cancers Panel offered by [facility] includes sequencing and/or deletion duplication testing of the following 43 genes: APC, ATM, AXIN2, BAP1, BARD1, BMPR1A, BRCA1, BRCA2, BRIP1, CDH1, CDK4, CDKN2A (p14ARF and p16INK4a only), CHEK2, CTNNA1, EPCAM (Deletion/duplication testing only), FH, GREM1 (promoter region duplication testing only), HOXB13, KIT, MBD4, MEN1, MLH1, MSH2, MSH3, MSH6, MUTYH, NF1, NHTL1, PALB2, PDGFRA, PMS2, POLD1, POLE, PTEN, RAD51C, RAD51D, SMAD4, SMARCA4. STK11, TP53, TSC1, TSC2, and VHL.  The test report has been scanned into EPIC and is located under the Molecular Pathology section of the Results Review tab.  A portion of the result report is included below for reference. Genetic testing reported out on 01/03/2023.      Even though a pathogenic variant was not identified, possible explanations for the cancer in the family may include: There may be no hereditary risk for cancer in the family. The cancers in Mr. Illes  and/or his family may be due to other genetic or environmental factors. There may be a gene mutation in one of these genes that current testing methods cannot detect, but that chance is small. There could be another gene that  has not yet been discovered, or that we have not yet tested, that is responsible for the cancer diagnoses in the family.   Therefore, it is important to remain in touch with cancer genetics in the future so that we can continue to offer Mr. Hsiao the most up to date genetic testing.   ADDITIONAL GENETIC TESTING:  We discussed with Mr. Gerhold that his genetic testing was fairly extensive.  If there are genes identified to increase cancer risk that can be analyzed in the future, we would be happy to discuss and coordinate this testing at that time.    CANCER SCREENING RECOMMENDATIONS:  Mr. Testa test result is considered negative (normal).  This means that we have not identified a hereditary cause for his personal history of cancer at this time. Most cancers happen by chance and this negative test suggests that his cancer may fall into this category.    An individual's cancer risk and medical management are not determined by genetic test results alone. Overall cancer risk assessment incorporates additional factors, including personal medical history, family history, and any available genetic information that may result in a personalized plan for cancer prevention and surveillance. Therefore, it is recommended he continue to follow the cancer management and screening guidelines provided by his oncology and primary healthcare provider.  FOLLOW-UP:  Cancer genetics is a rapidly advancing field and it is possible that new genetic tests will be appropriate for him and/or his family members in the future. We encouraged him to remain in contact with cancer genetics on an annual basis so we can update his personal and family histories and let him know of advances in cancer genetics that may benefit this family.   Our contact number was provided. Mr. Boxx questions were answered to his satisfaction, and he knows he is welcome to call us at anytime with additional questions or concerns.   Lalla Brothers,  MS, [facility] Genetic Counselor [email or web address] (P) [phone]

## 2023-01-12 ENCOUNTER — Inpatient Hospital Stay (HOSPITAL_BASED_OUTPATIENT_CLINIC_OR_DEPARTMENT_OTHER): Payer: 59 | Admitting: Nurse Practitioner

## 2023-01-12 ENCOUNTER — Inpatient Hospital Stay: Payer: 59

## 2023-01-12 ENCOUNTER — Encounter: Payer: Self-pay | Admitting: Hematology

## 2023-01-12 ENCOUNTER — Other Ambulatory Visit: Payer: Self-pay

## 2023-01-12 ENCOUNTER — Encounter: Payer: Self-pay | Admitting: Nurse Practitioner

## 2023-01-12 VITALS — BP 135/87 | HR 98 | Temp 97.2°F | Resp 16 | Wt 264.5 lb

## 2023-01-12 DIAGNOSIS — C61 Malignant neoplasm of prostate: Secondary | ICD-10-CM | POA: Diagnosis not present

## 2023-01-12 DIAGNOSIS — Z95828 Presence of other vascular implants and grafts: Secondary | ICD-10-CM

## 2023-01-12 DIAGNOSIS — Z5111 Encounter for antineoplastic chemotherapy: Secondary | ICD-10-CM | POA: Diagnosis not present

## 2023-01-12 LAB — CBC WITH DIFFERENTIAL (CANCER CENTER ONLY)
Abs Immature Granulocytes: 0.04 10*3/uL (ref 0.00–0.07)
Basophils Absolute: 0.1 10*3/uL (ref 0.0–0.1)
Basophils Relative: 2 %
Eosinophils Absolute: 0 10*3/uL (ref 0.0–0.5)
Eosinophils Relative: 0 %
HCT: 33.2 % — ABNORMAL LOW (ref 39.0–52.0)
Hemoglobin: 10.9 g/dL — ABNORMAL LOW (ref 13.0–17.0)
Immature Granulocytes: 1 %
Lymphocytes Relative: 34 %
Lymphs Abs: 1.2 10*3/uL (ref 0.7–4.0)
MCH: 25 pg — ABNORMAL LOW (ref 26.0–34.0)
MCHC: 32.8 g/dL (ref 30.0–36.0)
MCV: 76.1 fL — ABNORMAL LOW (ref 80.0–100.0)
Monocytes Absolute: 0.2 10*3/uL (ref 0.1–1.0)
Monocytes Relative: 6 %
Neutro Abs: 2 10*3/uL (ref 1.7–7.7)
Neutrophils Relative %: 57 %
Platelet Count: 228 10*3/uL (ref 150–400)
RBC: 4.36 MIL/uL (ref 4.22–5.81)
RDW: 14.5 % (ref 11.5–15.5)
WBC Count: 3.5 10*3/uL — ABNORMAL LOW (ref 4.0–10.5)
nRBC: 0 % (ref 0.0–0.2)

## 2023-01-12 LAB — CMP (CANCER CENTER ONLY)
ALT: 18 U/L (ref 0–44)
AST: 15 U/L (ref 15–41)
Albumin: 4.4 g/dL (ref 3.5–5.0)
Alkaline Phosphatase: 1151 U/L — ABNORMAL HIGH (ref 38–126)
Anion gap: 5 (ref 5–15)
BUN: 18 mg/dL (ref 6–20)
CO2: 28 mmol/L (ref 22–32)
Calcium: 9.1 mg/dL (ref 8.9–10.3)
Chloride: 103 mmol/L (ref 98–111)
Creatinine: 1.05 mg/dL (ref 0.61–1.24)
GFR, Estimated: >60 mL/min (ref >60)
Glucose, Bld: 147 mg/dL — ABNORMAL HIGH (ref 70–99)
Potassium: 4.2 mmol/L (ref 3.5–5.1)
Sodium: 136 mmol/L (ref 135–145)
Total Bilirubin: 0.6 mg/dL (ref 0.3–1.2)
Total Protein: 7 g/dL (ref 6.5–8.1)

## 2023-01-12 MED ORDER — SODIUM CHLORIDE 0.9% FLUSH
10.0000 mL | INTRAVENOUS | Status: DC | PRN
  Administered 2023-01-12: 10 mL via INTRAVENOUS

## 2023-01-12 MED ORDER — HEPARIN SOD (PORK) LOCK FLUSH 100 UNIT/ML IV SOLN
500.0000 [IU] | Freq: Once | INTRAVENOUS | Status: AC
  Administered 2023-01-12: 500 [IU] via INTRAVENOUS

## 2023-01-12 NOTE — Research (Signed)
[id number]: A RANDOMIZED TRIAL ADDRESSING CANCER-RELATED FINANCIAL HARDSHIP THROUGH DELIVERY OF A PROACTIVE FINANCIAL NAVIGATION INTERVENTION (CREDIT)  Met with patient to follow up on interest in research study. At this time, he and his wife are not interested in pursuing enrollment. Encouraged him to call me if they change their minds or if our team can be of assistance.  Margret Chance Jden Want, RN, BSN, [facility]  Her  [facility] Clinical Research Nurse [facility] Direct Dial [phone]  Pager [phone] 01/12/2023 11:33 AM

## 2023-01-12 NOTE — Progress Notes (Signed)
Patient Care Team: Orpha Bur, MD as PCP - General (Family Medicine) Malachy Mood, MD as Consulting Physician (Hematology and Oncology) Despina Arias, MD as Referring Physician (Urology) Christena Deem, MD as Consulting Physician (Sports Medicine) Bjorn Pippin, MD as Consulting Physician (Orthopedic Surgery)   CHIEF COMPLAINT: Follow-up metastatic prostate cancer  Oncology History Overview Note   Cancer Staging  Malignant neoplasm of prostate Staging form: Prostate, AJCC 8th Edition - Clinical stage from 11/05/2022: Stage IVB (cT1c, cN1, pM1c, PSA: 162, Grade Group: 5) - Signed by Marcello Fennel, PA-C on 12/24/2022 Histopathologic type: Adenocarcinoma, NOS Stage prefix: Initial diagnosis Prostate specific antigen (PSA) range: 20 or greater Gleason primary pattern: 4 Gleason secondary pattern: 5 Gleason score: 9 Histologic grading system: 5 grade system Number of biopsy cores examined: 12 Number of biopsy cores positive: 12     Malignant neoplasm of prostate (HCC)  11/05/2022 Cancer Staging   Staging form: Prostate, AJCC 8th Edition - Clinical stage from 11/05/2022: Stage IVB (cT1c, cN1, pM1c, PSA: 162, Grade Group: 5) - Signed by Marcello Fennel, PA-C on 12/24/2022 Histopathologic type: Adenocarcinoma, NOS Stage prefix: Initial diagnosis Prostate specific antigen (PSA) range: 20 or greater Gleason primary pattern: 4 Gleason secondary pattern: 5 Gleason score: 9 Histologic grading system: 5 grade system Number of biopsy cores examined: 12 Number of biopsy cores positive: 12   12/15/2022 PET scan    IMPRESSION: 1. Prostatic primary or primaries with nodal metastasis within the chest, abdomen, and pelvis. 2. The hepatic lesion on prior diagnostic CT is not tracer avid, arguing against metastatic disease. Likely an incidental benign lesion. 3. Widespread osseous metastasis.     12/24/2022 Initial Diagnosis   Malignant neoplasm of prostate   01/06/2023 -   Chemotherapy   Patient is on Treatment Plan : PROSTATE Docetaxel (75) q21d        CURRENT THERAPY:  Inj Firmagon starting 12/30/2022  Oral Zytiga and prednisone starting 01/02/2023  IV Docetaxel q. 21 days  INTERVAL HISTORY Timothy Rivera returns for toxicity check, here with his wife, last seen by Dr. Mosetta Putt 01/06/2023 and began cycle 1 Taxotere.  On days 3/4 he felt tired, achy like he was coming down with something.  He rested some but was still out of bed and active, able to eat and drink.  He had some diarrhea, managed with Imodium.  No nausea/vomiting.  Seldom hot flashes.  Denies fever or chills, Tmax 99.6.  He has noticed urine stream has improved, stronger and feels complete emptying.  Also reports hip pain, still mild and less frequent now.  ROS  All other systems reviewed and negative  Past Medical History:  Diagnosis Date   Azoospermia non-obstructive 2021   Diabetes mellitus without complication (HCC)    Elevated PSA 11/05/2022   10/09/2022   Gross hematuria 11/12/2022   11/05/2022, 10/09/2022   Torn Achilles tendon      Past Surgical History:  Procedure Laterality Date   IR IMAGING GUIDED PORT INSERTION  12/31/2022   LAPAROSCOPIC INGUINAL HERNIA REPAIR  1991   PROSTATE BIOPSY       Outpatient Encounter Medications as of 01/12/2023  Medication Sig   abiraterone acetate (ZYTIGA) 250 MG tablet Take 4 tablets (1,000 mg total) by mouth daily. Take on an empty stomach 1 hour before or 2 hours after a meal   Accu-Chek FastClix Lancets MISC as directed for 90 days   Blood Glucose Monitoring Suppl (ACCU-CHEK AVIVA PLUS) w/Device KIT as directed for 90 days  dexamethasone (DECADRON) 4 MG tablet Take 2 tabs by mouth 2 times daily starting day before chemo. Then take 2 tabs daily for 2 days starting day after chemo. Take with food.   Glucose Blood (BLOOD GLUCOSE TEST STRIPS 333) STRP as directed In Vitro once a day for 90 days   lidocaine-prilocaine (EMLA) cream Apply 1 Application  topically as needed. To port area before port access   lidocaine-prilocaine (EMLA) cream Apply to affected area once   metFORMIN (GLUCOPHAGE) 1000 MG tablet Take 1,000 mg by mouth 2 (two) times daily with a meal.   milk thistle 175 MG tablet Take 175 mg by mouth daily.   ondansetron (ZOFRAN) 8 MG tablet Take 1 tablet (8 mg total) by mouth every 8 (eight) hours as needed for nausea or vomiting.   pioglitazone (ACTOS) 30 MG tablet 1 tablet Orally Once a day for 90 days   predniSONE (DELTASONE) 5 MG tablet Take 1 tablet (5 mg total) by mouth daily with breakfast.   prochlorperazine (COMPAZINE) 10 MG tablet Take 1 tablet (10 mg total) by mouth every 6 (six) hours as needed for nausea or vomiting.   [DISCONTINUED] sodium chloride flush (NS) 0.9 % injection 10 mL    No facility-administered encounter medications on file as of 01/12/2023.     Today's Vitals   01/12/23 1110  BP: 135/87  Pulse: 98  Resp: 16  Temp: (!) 97.2 F (36.2 C)  TempSrc: Temporal  SpO2: 99%  Weight: 264 lb 8 oz (120 kg)   Body mass index is 33.96 kg/m.   PHYSICAL EXAM GENERAL:alert, no distress and comfortable SKIN: no rash  EYES: sclera clear, no lacrimation LUNGS: normal breathing effort HEART:  no lower extremity edema NEURO: alert & oriented x 3 with fluent speech, no focal motor/sensory deficits PAC without erythema    CBC    Component Value Date/Time   WBC 3.5 (L) 01/12/2023 1034   RBC 4.36 01/12/2023 1034   HGB 10.9 (L) 01/12/2023 1034   HCT 33.2 (L) 01/12/2023 1034   PLT 228 01/12/2023 1034   MCV 76.1 (L) 01/12/2023 1034   MCH 25.0 (L) 01/12/2023 1034   MCHC 32.8 01/12/2023 1034   RDW 14.5 01/12/2023 1034   LYMPHSABS 1.2 01/12/2023 1034   MONOABS 0.2 01/12/2023 1034   EOSABS 0.0 01/12/2023 1034   BASOSABS 0.1 01/12/2023 1034     CMP     Component Value Date/Time   NA 136 01/12/2023 1034   K 4.2 01/12/2023 1034   CL 103 01/12/2023 1034   CO2 28 01/12/2023 1034   GLUCOSE 147 (H)  01/12/2023 1034   BUN 18 01/12/2023 1034   CREATININE 1.05 01/12/2023 1034   CALCIUM 9.1 01/12/2023 1034   PROT 7.0 01/12/2023 1034   ALBUMIN 4.4 01/12/2023 1034   AST 15 01/12/2023 1034   ALT 18 01/12/2023 1034   ALKPHOS 1,151 (H) 01/12/2023 1034   BILITOT 0.6 01/12/2023 1034   GFRNONAA >60 01/12/2023 1034     ASSESSMENT & PLAN:Timothy Rivera is a 42 y.o. 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 -Unfortunately the PSMA scan showed diffuse bone metastasis, in spine, pelvic, ribs, humerus and femurs, he also has diffuse hypermetabolic thoracic, abdominal and pelvic adenopathy.  The liver lesion was not hypermetabolic,  so unlikely related to his prostate cancer. -Due to the high disease burden, Dr. Mosetta Putt recommended triple therapy with androgen deprivation (starting 12/30/22), Zytiga and prednisone (starting 01/02/23), and chemotherapy docetaxel q21d (starting 01/06/23). Timothy Rivera was denied as prophylaxis -Timothy Rivera appears stable.  S/p cycle 1 day 7 Taxotere, and continues Zytiga/prednisone. He tolerated treatment well, with mild fatigue, aching, and diarrhea on days 3/4. Side effects are adequately managed with supportive care at home. He is able to recover and function well -Clinically, urine stream and hip pain have improved; I reviewed these are good clinical indicators of treatment response. Will draw PSA next time for new baseline (162 on 10/09/22) -Labs reviewed, WBC trending down 3.5 today, ANC 2.0. will check lab next week and resubmit udenyca if he has become neutropenic -Return precautions reviewed -F/up and cycle 2 in 2 weeks as scheduled       PLAN: -Labs reviewed, monitor WBC/ANC with lab next week. If neutropenic will re-submit udenyca  request -Tolerating chemo, continue supportive care at home -f/up and cycle 2 in 2 weeks   Orders Placed This Encounter  Procedures   Prostate-Specific AG, Serum    Standing Status:   Future    Standing Expiration Date:   01/12/2024   Prostate-Specific AG, Serum    Standing Status:   Standing    Number of Occurrences:   20    Standing Expiration Date:   01/12/2024     All questions were answered. The patient knows to call the clinic with any problems, questions or concerns. No barriers to learning were detected.   Santiago Glad, NP-C 01/12/2023

## 2023-01-20 ENCOUNTER — Inpatient Hospital Stay: Payer: 59 | Attending: Hematology

## 2023-01-20 DIAGNOSIS — C7800 Secondary malignant neoplasm of unspecified lung: Secondary | ICD-10-CM | POA: Insufficient documentation

## 2023-01-20 DIAGNOSIS — Z5111 Encounter for antineoplastic chemotherapy: Secondary | ICD-10-CM | POA: Insufficient documentation

## 2023-01-20 DIAGNOSIS — C61 Malignant neoplasm of prostate: Secondary | ICD-10-CM | POA: Diagnosis present

## 2023-01-20 DIAGNOSIS — Z95828 Presence of other vascular implants and grafts: Secondary | ICD-10-CM

## 2023-01-20 DIAGNOSIS — C7951 Secondary malignant neoplasm of bone: Secondary | ICD-10-CM | POA: Diagnosis not present

## 2023-01-20 LAB — CMP (CANCER CENTER ONLY)
ALT: 9 U/L (ref 0–44)
AST: 10 U/L — ABNORMAL LOW (ref 15–41)
Albumin: 4.3 g/dL (ref 3.5–5.0)
Alkaline Phosphatase: 1372 U/L — ABNORMAL HIGH (ref 38–126)
Anion gap: 6 (ref 5–15)
BUN: 19 mg/dL (ref 6–20)
CO2: 29 mmol/L (ref 22–32)
Calcium: 9 mg/dL (ref 8.9–10.3)
Chloride: 104 mmol/L (ref 98–111)
Creatinine: 0.96 mg/dL (ref 0.61–1.24)
GFR, Estimated: >60 mL/min (ref >60)
Glucose, Bld: 122 mg/dL — ABNORMAL HIGH (ref 70–99)
Potassium: 3.9 mmol/L (ref 3.5–5.1)
Sodium: 139 mmol/L (ref 135–145)
Total Bilirubin: 0.4 mg/dL (ref 0.3–1.2)
Total Protein: 6.8 g/dL (ref 6.5–8.1)

## 2023-01-20 LAB — CBC WITH DIFFERENTIAL (CANCER CENTER ONLY)
Abs Immature Granulocytes: 0.06 10*3/uL (ref 0.00–0.07)
Basophils Absolute: 0.1 10*3/uL (ref 0.0–0.1)
Basophils Relative: 2 %
Eosinophils Absolute: 0 10*3/uL (ref 0.0–0.5)
Eosinophils Relative: 1 %
HCT: 32.7 % — ABNORMAL LOW (ref 39.0–52.0)
Hemoglobin: 10.7 g/dL — ABNORMAL LOW (ref 13.0–17.0)
Immature Granulocytes: 1 %
Lymphocytes Relative: 44 %
Lymphs Abs: 2.1 10*3/uL (ref 0.7–4.0)
MCH: 25.1 pg — ABNORMAL LOW (ref 26.0–34.0)
MCHC: 32.7 g/dL (ref 30.0–36.0)
MCV: 76.6 fL — ABNORMAL LOW (ref 80.0–100.0)
Monocytes Absolute: 0.9 10*3/uL (ref 0.1–1.0)
Monocytes Relative: 18 %
Neutro Abs: 1.6 10*3/uL — ABNORMAL LOW (ref 1.7–7.7)
Neutrophils Relative %: 34 %
Platelet Count: 223 10*3/uL (ref 150–400)
RBC: 4.27 MIL/uL (ref 4.22–5.81)
RDW: 15 % (ref 11.5–15.5)
WBC Count: 4.8 10*3/uL (ref 4.0–10.5)
nRBC: 0 % (ref 0.0–0.2)

## 2023-01-20 MED ORDER — SODIUM CHLORIDE 0.9% FLUSH
10.0000 mL | INTRAVENOUS | Status: DC | PRN
  Administered 2023-01-20: 10 mL via INTRAVENOUS

## 2023-01-20 MED ORDER — HEPARIN SOD (PORK) LOCK FLUSH 100 UNIT/ML IV SOLN
500.0000 [IU] | Freq: Once | INTRAVENOUS | Status: AC
  Administered 2023-01-20: 500 [IU] via INTRAVENOUS

## 2023-01-22 LAB — PROSTATE-SPECIFIC AG, SERUM (LABCORP): Prostate Specific Ag, Serum: 12.3 ng/mL — ABNORMAL HIGH (ref 0.0–4.0)

## 2023-01-26 MED FILL — Dexamethasone Sodium Phosphate Inj 100 MG/10ML: INTRAMUSCULAR | Qty: 1 | Status: AC

## 2023-01-26 NOTE — Assessment & Plan Note (Signed)
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 -Unfortunately the PSMA scan showed diffuse bone metastasis, in spine, pelvic, ribs, humerus and femurs, he also has diffuse hypermetabolic adenopathy in thoracic, abdomen and pelvis.  The liver lesion was not hypermetabolic, so unlikely related to his prostate cancer. --Due to the high disease burden, I recommend triple therapy with androgen deprivation, Zytiga and prednisone, and chemotherapy docetaxel every 3 weeks for 6 cycles.  -he has received first ADT Firmagon on 12/30/22, and started Zytiga and prednisone on 01/02/2023 -He started chemo Docetaxel on 01/06/2023

## 2023-01-27 ENCOUNTER — Encounter: Payer: Self-pay | Admitting: Hematology

## 2023-01-27 ENCOUNTER — Inpatient Hospital Stay (HOSPITAL_BASED_OUTPATIENT_CLINIC_OR_DEPARTMENT_OTHER): Payer: 59 | Admitting: Hematology

## 2023-01-27 ENCOUNTER — Inpatient Hospital Stay: Payer: 59

## 2023-01-27 VITALS — BP 145/95 | HR 95 | Temp 98.1°F | Resp 18 | Ht 74.0 in | Wt 263.9 lb

## 2023-01-27 DIAGNOSIS — C61 Malignant neoplasm of prostate: Secondary | ICD-10-CM

## 2023-01-27 DIAGNOSIS — Z95828 Presence of other vascular implants and grafts: Secondary | ICD-10-CM

## 2023-01-27 DIAGNOSIS — Z5111 Encounter for antineoplastic chemotherapy: Secondary | ICD-10-CM | POA: Diagnosis not present

## 2023-01-27 LAB — CMP (CANCER CENTER ONLY)
ALT: 9 U/L (ref 0–44)
AST: 11 U/L — ABNORMAL LOW (ref 15–41)
Albumin: 4.2 g/dL (ref 3.5–5.0)
Alkaline Phosphatase: 1157 U/L — ABNORMAL HIGH (ref 38–126)
Anion gap: 6 (ref 5–15)
BUN: 20 mg/dL (ref 6–20)
CO2: 30 mmol/L (ref 22–32)
Calcium: 9 mg/dL (ref 8.9–10.3)
Chloride: 103 mmol/L (ref 98–111)
Creatinine: 1.16 mg/dL (ref 0.61–1.24)
GFR, Estimated: >60 mL/min (ref >60)
Glucose, Bld: 122 mg/dL — ABNORMAL HIGH (ref 70–99)
Potassium: 4.1 mmol/L (ref 3.5–5.1)
Sodium: 139 mmol/L (ref 135–145)
Total Bilirubin: 0.4 mg/dL (ref 0.3–1.2)
Total Protein: 6.7 g/dL (ref 6.5–8.1)

## 2023-01-27 LAB — CBC WITH DIFFERENTIAL (CANCER CENTER ONLY)
Abs Immature Granulocytes: 0.11 10*3/uL — ABNORMAL HIGH (ref 0.00–0.07)
Basophils Absolute: 0.1 10*3/uL (ref 0.0–0.1)
Basophils Relative: 1 %
Eosinophils Absolute: 0.1 10*3/uL (ref 0.0–0.5)
Eosinophils Relative: 1 %
HCT: 33.2 % — ABNORMAL LOW (ref 39.0–52.0)
Hemoglobin: 11.2 g/dL — ABNORMAL LOW (ref 13.0–17.0)
Immature Granulocytes: 1 %
Lymphocytes Relative: 28 %
Lymphs Abs: 2.2 10*3/uL (ref 0.7–4.0)
MCH: 25.6 pg — ABNORMAL LOW (ref 26.0–34.0)
MCHC: 33.7 g/dL (ref 30.0–36.0)
MCV: 76 fL — ABNORMAL LOW (ref 80.0–100.0)
Monocytes Absolute: 0.8 10*3/uL (ref 0.1–1.0)
Monocytes Relative: 11 %
Neutro Abs: 4.5 10*3/uL (ref 1.7–7.7)
Neutrophils Relative %: 58 %
Platelet Count: 208 10*3/uL (ref 150–400)
RBC: 4.37 MIL/uL (ref 4.22–5.81)
RDW: 15.1 % (ref 11.5–15.5)
WBC Count: 7.8 10*3/uL (ref 4.0–10.5)
nRBC: 0 % (ref 0.0–0.2)

## 2023-01-27 MED ORDER — SODIUM CHLORIDE 0.9 % IV SOLN
10.0000 mg | Freq: Once | INTRAVENOUS | Status: AC
  Administered 2023-01-27: 10 mg via INTRAVENOUS
  Filled 2023-01-27: qty 10

## 2023-01-27 MED ORDER — SODIUM CHLORIDE 0.9 % IV SOLN
75.0000 mg/m2 | Freq: Once | INTRAVENOUS | Status: AC
  Administered 2023-01-27: 189 mg via INTRAVENOUS
  Filled 2023-01-27: qty 18.9

## 2023-01-27 MED ORDER — SODIUM CHLORIDE 0.9 % IV SOLN: Freq: Once | INTRAVENOUS | Status: AC

## 2023-01-27 MED ORDER — LEUPROLIDE ACETATE (4 MONTH) 30 MG ~~LOC~~ KIT
30.0000 mg | PACK | Freq: Once | SUBCUTANEOUS | Status: AC
  Administered 2023-01-27: 30 mg via SUBCUTANEOUS
  Filled 2023-01-27: qty 30

## 2023-01-27 MED ORDER — PALONOSETRON HCL INJECTION 0.25 MG/5ML
0.2500 mg | Freq: Once | INTRAVENOUS | Status: AC
  Administered 2023-01-27: 0.25 mg via INTRAVENOUS
  Filled 2023-01-27: qty 5

## 2023-01-27 MED ORDER — SODIUM CHLORIDE 0.9% FLUSH
10.0000 mL | INTRAVENOUS | Status: DC | PRN
  Administered 2023-01-27: 10 mL

## 2023-01-27 MED ORDER — HEPARIN SOD (PORK) LOCK FLUSH 100 UNIT/ML IV SOLN
500.0000 [IU] | Freq: Once | INTRAVENOUS | Status: AC | PRN
  Administered 2023-01-27: 500 [IU]

## 2023-01-27 NOTE — Patient Instructions (Signed)
[place] [facility]  Discharge Instructions: Thank you for choosing [facility] to provide your oncology and hematology care.   If you have a lab appointment with the Cancer Center, please go directly to the Cancer Center and check in at the registration area.   Wear comfortable clothing and clothing appropriate for easy access to any Portacath or PICC line.   We strive to give you quality time with your provider. You may need to reschedule your appointment if you arrive late (15 or more minutes).  Arriving late affects you and other patients whose appointments are after yours.  Also, if you miss three or more appointments without notifying the office, you may be dismissed from the clinic at the provider's discretion.      For prescription refill requests, have your pharmacy contact our office and allow 72 hours for refills to be completed.    Today you received the following chemotherapy and/or immunotherapy agents Docetaxel     To help prevent nausea and vomiting after your treatment, we encourage you to take your nausea medication as directed.  BELOW ARE SYMPTOMS THAT SHOULD BE REPORTED IMMEDIATELY: *FEVER GREATER THAN 100.4 F (38 C) OR HIGHER *CHILLS OR SWEATING *NAUSEA AND VOMITING THAT IS NOT CONTROLLED WITH YOUR NAUSEA MEDICATION *UNUSUAL SHORTNESS OF BREATH *UNUSUAL BRUISING OR BLEEDING *URINARY PROBLEMS (pain or burning when urinating, or frequent urination) *BOWEL PROBLEMS (unusual diarrhea, constipation, pain near the anus) TENDERNESS IN MOUTH AND THROAT WITH OR WITHOUT PRESENCE OF ULCERS (sore throat, sores in mouth, or a toothache) UNUSUAL RASH, SWELLING OR PAIN  UNUSUAL VAGINAL DISCHARGE OR ITCHING   Items with * indicate a potential emergency and should be followed up as soon as possible or go to the Emergency Department if any problems should occur.  Please show the CHEMOTHERAPY ALERT CARD or IMMUNOTHERAPY ALERT CARD at  check-in to the Emergency Department and triage nurse.  Should you have questions after your visit or need to cancel or reschedule your appointment, please contact [place] [facility]  Dept: [phone]  and follow the prompts.  Office hours are 8:00 a.m. to 4:30 p.m. Monday - Friday. Please note that voicemails left after 4:00 p.m. may not be returned until the following business day.  We are closed weekends and major holidays. You have access to a nurse at all times for urgent questions. Please call the main number to the clinic Dept: [phone] and follow the prompts.   For any non-urgent questions, you may also contact your provider using MyChart. We now offer e-Visits for anyone 70 and older to request care online for non-urgent symptoms. For details visit [email or web address].   Also download the MyChart app! Go to the app store, search "MyChart", open the app, select [facility], and log in with your MyChart username and password.   Leuprolide Suspension for Injection (Prostate Cancer) What is this medication? LEUPROLIDE (loo PROE lide) reduces the symptoms of prostate cancer. It works by decreasing levels of the hormone testosterone in the body. This prevents prostate cancer cells from spreading or growing. This medicine may be used for other purposes; ask your health care provider or pharmacist if you have questions. COMMON BRAND NAME(S): Eligard, Lupron Depot, Lupron Depot-Ped, Lutrate Depot, Viadur What should I tell my care team before I take this medication? They need to know if you have any of these conditions: Diabetes Heart disease Heart failure High or low levels of electrolytes, such  as magnesium, potassium, or sodium in your blood Irregular heartbeat or rhythm Seizures An unusual or allergic reaction to leuprolide, other medications, foods, dyes, or preservatives Pregnant or trying to get pregnant Breast-feeding How should I use this  medication? This medication is injected under the skin or into a muscle. It is given by your care team in a hospital or clinic setting. Talk to your care team about the use of this medication in children. Special care may be needed. Overdosage: If you think you have taken too much of this medicine contact a poison control center or emergency room at once. NOTE: This medicine is only for you. Do not share this medicine with others. What if I miss a dose? Keep appointments for follow-up doses. It is important not to miss your dose. Call your care team if you are unable to keep an appointment. What may interact with this medication? Do not take this medication with any of the following: Cisapride Dronedarone Ketoconazole Levoketoconazole Pimozide Thioridazine This medication may also interact with the following: Other medications that cause heart rhythm changes This list may not describe all possible interactions. Give your health care provider a list of all the medicines, herbs, non-prescription drugs, or dietary supplements you use. Also tell them if you smoke, drink alcohol, or use illegal drugs. Some items may interact with your medicine. What should I watch for while using this medication? Visit your care team for regular checks on your progress. Tell your care team if your symptoms do not start to get better or if they get worse. This medication may increase blood sugar. The risk may be higher in patients who already have diabetes. Ask your care team what you can do to lower the risk of diabetes while taking this medication. This medication may cause infertility. Talk to your care team if you are concerned about your fertility. Heart attacks and strokes have been reported with the use of this medication. Get emergency help if you develop signs or symptoms of a heart attack or stroke. Talk to your care team about the risks and benefits of this medication. What side effects may I notice from  receiving this medication? Side effects that you should report to your care team as soon as possible: Allergic reactions--skin rash, itching, hives, swelling of the face, lips, tongue, or throat Heart attack--pain or tightness in the chest, shoulders, arms, or jaw, nausea, shortness of breath, cold or clammy skin, feeling faint or lightheaded Heart rhythm changes--fast or irregular heartbeat, dizziness, feeling faint or lightheaded, chest pain, trouble breathing High blood sugar (hyperglycemia)--increased thirst or amount of urine, unusual weakness or fatigue, blurry vision Mood swings, irritability, hostility Seizures Stroke--sudden numbness or weakness of the face, arm, or leg, trouble speaking, confusion, trouble walking, loss of balance or coordination, dizziness, severe headache, change in vision Thoughts of suicide or self-harm, worsening mood, feelings of depression Side effects that usually do not require medical attention (report to your care team if they continue or are bothersome): Bone pain Change in sex drive or performance General discomfort and fatigue Hot flashes Muscle pain Pain, redness, or irritation at injection site Swelling of the ankles, hands, or feet This list may not describe all possible side effects. Call your doctor for medical advice about side effects. You may report side effects to FDA at 1-800-FDA-1088. Where should I keep my medication? This medication is given in a hospital or clinic. It will not be stored at home. NOTE: This sheet is a summary. It  may not cover all possible information. If you have questions about this medicine, talk to your doctor, pharmacist, or health care provider.  2023 Elsevier/Gold Standard (2021-11-10 00:00:00)

## 2023-01-27 NOTE — Progress Notes (Signed)
[facility]   Telephone:(336) [phone] Fax:(336) [phone]   Clinic Follow up Note   Patient Care Team: Orpha Bur, MD as PCP - General (Family Medicine) Malachy Mood, MD as Consulting Physician (Hematology and Oncology) Despina Arias, MD as Referring Physician (Urology) Christena Deem, MD as Consulting Physician (Sports Medicine) Bjorn Pippin, MD as Consulting Physician (Orthopedic Surgery)  Date of Service:  01/27/2023  CHIEF COMPLAINT: f/u of Prostate Cancer   CURRENT THERAPY:  Prostate Docetaxel q21d    ASSESSMENT:  Timothy Rivera is a 42 y.o. 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 -Unfortunately the PSMA scan showed diffuse bone metastasis, in spine, pelvic, ribs, humerus and femurs, he also has diffuse hypermetabolic adenopathy in thoracic, abdomen and pelvis.  The liver lesion was not hypermetabolic, so unlikely related to his prostate cancer. --Due to the high disease burden, I recommend triple therapy with androgen deprivation, Zytiga and prednisone, and chemotherapy docetaxel every 3 weeks for 6 cycles.  -he has received first ADT Firmagon on 12/30/22, and started Zytiga and prednisone on 01/02/2023 -He started chemo Docetaxel on 01/06/2023, he tolerated first cycle well except a few days still fatigue.  He has no urinary symptoms and hip pain has improved since he started treatment, PSA has dropped, indicating good response to systemic treatment. -Lab reviewed, adequate for treatment, will proceed cycle 2 docetaxel today at the same dose. -He tolerated first cycle chemo well without dexamethasone pre and after chemo, will continue chemo without oral dexamethasone.    PLAN: - reviewed labs - Proceed with  treatment today - f/u in 3 weeks with labs and C3 - Proceed with Eligard injection today, continue Zytiga and prednisone   SUMMARY OF ONCOLOGIC HISTORY: Oncology History Overview Note   Cancer Staging  Malignant neoplasm of prostate Staging form: Prostate, AJCC 8th Edition - Clinical stage from 11/05/2022: Stage IVB (cT1c, cN1, pM1c, PSA: 162, Grade Group: 5) - Signed by Marcello Fennel, PA-C on 12/24/2022 Histopathologic type: Adenocarcinoma, NOS Stage prefix: Initial diagnosis Prostate specific antigen (PSA) range: 20 or greater Gleason primary pattern: 4 Gleason secondary pattern: 5 Gleason score: 9 Histologic grading system: 5 grade system Number of biopsy cores examined: 12 Number of biopsy cores positive: 12     Malignant neoplasm of prostate (HCC)  11/05/2022 Cancer Staging   Staging form: Prostate, AJCC 8th Edition - Clinical stage from 11/05/2022: Stage IVB (cT1c, cN1, pM1c, PSA: 162, Grade Group: 5) - Signed by Marcello Fennel, PA-C on 12/24/2022 Histopathologic type: Adenocarcinoma, NOS Stage prefix: Initial diagnosis Prostate specific antigen (PSA) range: 20 or greater Gleason primary pattern: 4 Gleason secondary pattern: 5 Gleason score: 9 Histologic grading system: 5 grade system Number of biopsy cores examined: 12 Number of biopsy cores positive: 12   12/15/2022 PET scan    IMPRESSION: 1. Prostatic primary or primaries with nodal metastasis within the chest, abdomen, and pelvis. 2. The hepatic lesion on prior diagnostic CT is not tracer avid, arguing against metastatic disease. Likely an incidental benign lesion. 3. Widespread osseous metastasis.     12/24/2022 Initial Diagnosis   Malignant neoplasm of prostate   01/06/2023 -  Chemotherapy   Patient is on Treatment Plan : PROSTATE Docetaxel (75) q21d        INTERVAL HISTORY:  Timothy Rivera is here for a follow up of  Prostate Cancer . He was last seen by me on 01/06/2023. He presents to the clinic  accompanied by wife and mother. Pt stated that he has done well since his first treatment. Some diarrhea but he took imodium and he was fine. PSA level has gone down.  No pain today, urination is back to normal.    All other systems were reviewed with the patient and are negative.  MEDICAL HISTORY:  Past Medical History:  Diagnosis Date   Azoospermia non-obstructive 2021   Diabetes mellitus without complication (HCC)    Elevated PSA 11/05/2022   10/09/2022   Gross hematuria 11/12/2022   11/05/2022, 10/09/2022   Torn Achilles tendon     SURGICAL HISTORY: Past Surgical History:  Procedure Laterality Date   IR IMAGING GUIDED PORT INSERTION  12/31/2022   LAPAROSCOPIC INGUINAL HERNIA REPAIR  1991   PROSTATE BIOPSY      I have reviewed the social history and family history with the patient and they are unchanged from previous note.  ALLERGIES:  has No Known Allergies.  MEDICATIONS:  Current Outpatient Medications  Medication Sig Dispense Refill   abiraterone acetate (ZYTIGA) 250 MG tablet Take 4 tablets (1,000 mg total) by mouth daily. Take on an empty stomach 1 hour before or 2 hours after a meal 120 tablet 2   Accu-Chek FastClix Lancets MISC as directed for 90 days     Blood Glucose Monitoring Suppl (ACCU-CHEK AVIVA PLUS) w/Device KIT as directed for 90 days     dexamethasone (DECADRON) 4 MG tablet Take 2 tabs by mouth 2 times daily starting day before chemo. Then take 2 tabs daily for 2 days starting day after chemo. Take with food. 30 tablet 1   Glucose Blood (BLOOD GLUCOSE TEST STRIPS 333) STRP as directed In Vitro once a day for 90 days     lidocaine-prilocaine (EMLA) cream Apply 1 Application topically as needed. To port area before port access 30 g 2   lidocaine-prilocaine (EMLA) cream Apply to affected area once 30 g 3   metFORMIN (GLUCOPHAGE) 1000 MG tablet Take 1,000 mg by mouth 2 (two) times daily with a meal.     milk thistle 175 MG tablet Take 175 mg by mouth daily.      ondansetron (ZOFRAN) 8 MG tablet Take 1 tablet (8 mg total) by mouth every 8 (eight) hours as needed for nausea or vomiting. 30 tablet 1   pioglitazone (ACTOS) 30 MG tablet 1 tablet Orally Once a day for 90 days     predniSONE (DELTASONE) 5 MG tablet Take 1 tablet (5 mg total) by mouth daily with breakfast. 30 tablet 3   prochlorperazine (COMPAZINE) 10 MG tablet Take 1 tablet (10 mg total) by mouth every 6 (six) hours as needed for nausea or vomiting. 30 tablet 1   No current facility-administered medications for this visit.    PHYSICAL EXAMINATION: ECOG PERFORMANCE STATUS: 1 - Symptomatic but completely ambulatory  Vitals:   01/27/23 1011  BP: (!) 145/95  Pulse: 95  Resp: 18  Temp: 98.1 F (36.7 C)  SpO2: 99%   Wt Readings from Last 3 Encounters:  01/27/23 263 lb 14.4 oz (119.7 kg)  01/12/23 264 lb 8 oz (120 kg)  01/06/23 268 lb 1.6 oz (121.6 kg)     GENERAL:alert, no distress and comfortable SKIN: skin color, texture, turgor are normal, no rashes or significant lesions EYES: normal, Conjunctiva are pink and non-injected, sclera  clear NECK: supple, thyroid normal size, non-tender, without nodularity LYMPH:  no palpable lymphadenopathy in the cervical, axillary  LUNGS: clear to auscultation and percussion with normal breathing effort HEART: regular rate & rhythm and no murmurs and no lower extremity edema ABDOMEN:abdomen soft, non-tender and normal bowel sounds Musculoskeletal:no cyanosis of digits and no clubbing  NEURO: alert & oriented x 3 with fluent speech, no focal motor/sensory deficits  LABORATORY DATA:  I have reviewed the data as listed    Latest Ref Rng & Units 01/27/2023    9:59 AM 01/20/2023    9:14 AM 01/12/2023   10:34 AM  CBC  WBC 4.0 - 10.5 K/uL 7.8  4.8  3.5   Hemoglobin 13.0 - 17.0 g/dL 16.1  09.6  04.5   Hematocrit 39.0 - 52.0 % 33.2  32.7  33.2   Platelets 150 - 400 K/uL 208  223  228         Latest Ref Rng & Units 01/27/2023    9:59 AM 01/20/2023     9:14 AM 01/12/2023   10:34 AM  CMP  Glucose 70 - 99 mg/dL 409  811  914   BUN 6 - 20 mg/dL 20  19  18    Creatinine 0.61 - 1.24 mg/dL 7.82  9.56  2.13   Sodium 135 - 145 mmol/L 139  139  136   Potassium 3.5 - 5.1 mmol/L 4.1  3.9  4.2   Chloride 98 - 111 mmol/L 103  104  103   CO2 22 - 32 mmol/L 30  29  28    Calcium 8.9 - 10.3 mg/dL 9.0  9.0  9.1   Total Protein 6.5 - 8.1 g/dL 6.7  6.8  7.0   Total Bilirubin 0.3 - 1.2 mg/dL 0.4  0.4  0.6   Alkaline Phos 38 - 126 U/L 1,157  1,372  1,151   AST 15 - 41 U/L 11  10  15    ALT 0 - 44 U/L 9  9  18        RADIOGRAPHIC STUDIES: I have personally reviewed the radiological images as listed and agreed with the findings in the report. No results found.    No orders of the defined types were placed in this encounter.  All questions were answered. The patient knows to call the clinic with any problems, questions or concerns. No barriers to learning was detected. The total time spent in the appointment was 25 minutes.     Malachy Mood, MD 01/27/2023   I, Sharlette Dense, CMA, am acting as scribe for Malachy Mood, MD.   I have reviewed the above documentation for accuracy and completeness, and I agree with the above.

## 2023-01-27 NOTE — Progress Notes (Signed)
Error

## 2023-01-28 ENCOUNTER — Other Ambulatory Visit: Payer: Self-pay

## 2023-01-29 ENCOUNTER — Ambulatory Visit: Payer: 59

## 2023-01-29 ENCOUNTER — Other Ambulatory Visit (HOSPITAL_COMMUNITY): Payer: Self-pay

## 2023-02-16 MED FILL — Dexamethasone Sodium Phosphate Inj 100 MG/10ML: INTRAMUSCULAR | Qty: 1 | Status: AC

## 2023-02-16 NOTE — Assessment & Plan Note (Signed)
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 -Unfortunately the PSMA scan showed diffuse bone metastasis, in spine, pelvic, ribs, humerus and femurs, he also has diffuse hypermetabolic adenopathy in thoracic, abdomen and pelvis.  The liver lesion was not hypermetabolic, so unlikely related to his prostate cancer. --Due to the high disease burden, I recommend triple therapy with androgen deprivation, Zytiga and prednisone, and chemotherapy docetaxel every 3 weeks for 6 cycles.  -he has received first ADT Firmagon on 12/30/22, and started Zytiga and prednisone on 01/02/2023 -He started chemo Docetaxel on 01/06/2023, he tolerated first cycle well except a few days still fatigue.  He has no urinary symptoms and hip pain has improved since he started treatment, PSA has dropped, indicating good response to systemic treatment. -Lab reviewed, adequate for treatment, will proceed cycle 2 docetaxel today at the same dose. -He tolerated first cycle chemo well without dexamethasone pre and after chemo, will continue chemo without oral dexamethasone.

## 2023-02-17 ENCOUNTER — Encounter: Payer: Self-pay | Admitting: Hematology

## 2023-02-17 ENCOUNTER — Inpatient Hospital Stay: Payer: 59

## 2023-02-17 ENCOUNTER — Other Ambulatory Visit: Payer: Self-pay

## 2023-02-17 ENCOUNTER — Inpatient Hospital Stay: Payer: 59 | Attending: Hematology | Admitting: Hematology

## 2023-02-17 VITALS — BP 138/93 | HR 88 | Temp 98.2°F | Resp 18 | Ht 74.0 in | Wt 258.7 lb

## 2023-02-17 DIAGNOSIS — Z5111 Encounter for antineoplastic chemotherapy: Secondary | ICD-10-CM | POA: Diagnosis present

## 2023-02-17 DIAGNOSIS — C7951 Secondary malignant neoplasm of bone: Secondary | ICD-10-CM | POA: Insufficient documentation

## 2023-02-17 DIAGNOSIS — C61 Malignant neoplasm of prostate: Secondary | ICD-10-CM | POA: Insufficient documentation

## 2023-02-17 DIAGNOSIS — Z95828 Presence of other vascular implants and grafts: Secondary | ICD-10-CM

## 2023-02-17 LAB — CMP (CANCER CENTER ONLY)
ALT: 9 U/L (ref 0–44)
AST: 10 U/L — ABNORMAL LOW (ref 15–41)
Albumin: 4.3 g/dL (ref 3.5–5.0)
Alkaline Phosphatase: 324 U/L — ABNORMAL HIGH (ref 38–126)
Anion gap: 7 (ref 5–15)
BUN: 23 mg/dL — ABNORMAL HIGH (ref 6–20)
CO2: 28 mmol/L (ref 22–32)
Calcium: 9.3 mg/dL (ref 8.9–10.3)
Chloride: 104 mmol/L (ref 98–111)
Creatinine: 1.15 mg/dL (ref 0.61–1.24)
GFR, Estimated: >60 mL/min (ref >60)
Glucose, Bld: 135 mg/dL — ABNORMAL HIGH (ref 70–99)
Potassium: 4.1 mmol/L (ref 3.5–5.1)
Sodium: 139 mmol/L (ref 135–145)
Total Bilirubin: 0.5 mg/dL (ref 0.3–1.2)
Total Protein: 6.9 g/dL (ref 6.5–8.1)

## 2023-02-17 LAB — CBC WITH DIFFERENTIAL (CANCER CENTER ONLY)
Abs Immature Granulocytes: 0.03 10*3/uL (ref 0.00–0.07)
Basophils Absolute: 0.1 10*3/uL (ref 0.0–0.1)
Basophils Relative: 1 %
Eosinophils Absolute: 0.1 10*3/uL (ref 0.0–0.5)
Eosinophils Relative: 1 %
HCT: 32.1 % — ABNORMAL LOW (ref 39.0–52.0)
Hemoglobin: 10.8 g/dL — ABNORMAL LOW (ref 13.0–17.0)
Immature Granulocytes: 1 %
Lymphocytes Relative: 29 %
Lymphs Abs: 1.9 10*3/uL (ref 0.7–4.0)
MCH: 25.7 pg — ABNORMAL LOW (ref 26.0–34.0)
MCHC: 33.6 g/dL (ref 30.0–36.0)
MCV: 76.4 fL — ABNORMAL LOW (ref 80.0–100.0)
Monocytes Absolute: 0.8 10*3/uL (ref 0.1–1.0)
Monocytes Relative: 12 %
Neutro Abs: 3.7 10*3/uL (ref 1.7–7.7)
Neutrophils Relative %: 56 %
Platelet Count: 239 10*3/uL (ref 150–400)
RBC: 4.2 MIL/uL — ABNORMAL LOW (ref 4.22–5.81)
RDW: 15.9 % — ABNORMAL HIGH (ref 11.5–15.5)
WBC Count: 6.5 10*3/uL (ref 4.0–10.5)
nRBC: 0 % (ref 0.0–0.2)

## 2023-02-17 MED ORDER — SODIUM CHLORIDE 0.9 % IV SOLN
10.0000 mg | Freq: Once | INTRAVENOUS | Status: AC
  Administered 2023-02-17: 10 mg via INTRAVENOUS
  Filled 2023-02-17: qty 10

## 2023-02-17 MED ORDER — PALONOSETRON HCL INJECTION 0.25 MG/5ML
0.2500 mg | Freq: Once | INTRAVENOUS | Status: AC
  Administered 2023-02-17: 0.25 mg via INTRAVENOUS
  Filled 2023-02-17: qty 5

## 2023-02-17 MED ORDER — SODIUM CHLORIDE 0.9% FLUSH
10.0000 mL | INTRAVENOUS | Status: DC | PRN
  Administered 2023-02-17: 10 mL

## 2023-02-17 MED ORDER — SODIUM CHLORIDE 0.9 % IV SOLN: Freq: Once | INTRAVENOUS | Status: AC

## 2023-02-17 MED ORDER — SODIUM CHLORIDE 0.9 % IV SOLN
75.0000 mg/m2 | Freq: Once | INTRAVENOUS | Status: AC
  Administered 2023-02-17: 189 mg via INTRAVENOUS
  Filled 2023-02-17: qty 18.9

## 2023-02-17 MED ORDER — HEPARIN SOD (PORK) LOCK FLUSH 100 UNIT/ML IV SOLN
500.0000 [IU] | Freq: Once | INTRAVENOUS | Status: AC | PRN
  Administered 2023-02-17: 500 [IU]

## 2023-02-17 MED ORDER — SODIUM CHLORIDE 0.9% FLUSH
10.0000 mL | INTRAVENOUS | Status: DC | PRN
  Administered 2023-02-17: 10 mL via INTRAVENOUS

## 2023-02-17 NOTE — Progress Notes (Signed)
[facility]   Telephone:(336) [phone] Fax:(336) [phone]   Clinic Follow up Note   Patient Care Team: Orpha Bur, MD as PCP - General (Family Medicine) Malachy Mood, MD as Consulting Physician (Hematology and Oncology) Despina Arias, MD as Referring Physician (Urology) Christena Deem, MD as Consulting Physician (Sports Medicine) Bjorn Pippin, MD as Consulting Physician (Orthopedic Surgery)  Date of Service:  02/17/2023  CHIEF COMPLAINT: f/u of  Prostate Cancer     CURRENT THERAPY:  Prostate Docetaxel q21d   ASSESSMENT:  Timothy Rivera is a 42 y.o. 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 -Unfortunately the PSMA scan showed diffuse bone metastasis, in spine, pelvic, ribs, humerus and femurs, he also has diffuse hypermetabolic adenopathy in thoracic, abdomen and pelvis.  The liver lesion was not hypermetabolic, so unlikely related to his prostate cancer. --Due to the high disease burden, I recommend triple therapy with androgen deprivation, Zytiga and prednisone, and chemotherapy docetaxel every 3 weeks for 6 cycles.  -he has received first ADT Firmagon on 12/30/22, and started Zytiga and prednisone on 01/02/2023 -He started chemo Docetaxel on 01/06/2023, he tolerated first cycle well except a few days still fatigue.  He has no urinary symptoms and hip pain has improved since he started treatment, PSA has dropped, indicating good response to systemic treatment. -Lab reviewed, adequate for treatment, will proceed cycle 2 docetaxel today at the same dose. -He tolerated first cycle chemo well without dexamethasone pre and after chemo, will continue chemo without oral dexamethasone. -pt and his father had multiple questions about his  prognosis and overall treatment plan, I answered to their satisfaction.    PLAN: -lab reviewed -PSA- trending down -proceed with C3 Taxotere today labs are adequate  -lab/flush and treatment 7/17    SUMMARY OF ONCOLOGIC HISTORY: Oncology History Overview Note   Cancer Staging  Malignant neoplasm of prostate Staging form: Prostate, AJCC 8th Edition - Clinical stage from 11/05/2022: Stage IVB (cT1c, cN1, pM1c, PSA: 162, Grade Group: 5) - Signed by Marcello Fennel, PA-C on 12/24/2022 Histopathologic type: Adenocarcinoma, NOS Stage prefix: Initial diagnosis Prostate specific antigen (PSA) range: 20 or greater Gleason primary pattern: 4 Gleason secondary pattern: 5 Gleason score: 9 Histologic grading system: 5 grade system Number of biopsy cores examined: 12 Number of biopsy cores positive: 12     Malignant neoplasm of prostate (HCC)  11/05/2022 Cancer Staging   Staging form: Prostate, AJCC 8th Edition - Clinical stage from 11/05/2022: Stage IVB (cT1c, cN1, pM1c, PSA: 162, Grade Group: 5) - Signed by Marcello Fennel, PA-C on 12/24/2022 Histopathologic type: Adenocarcinoma, NOS Stage prefix: Initial diagnosis Prostate specific antigen (PSA) range: 20 or greater Gleason primary pattern: 4 Gleason secondary pattern: 5 Gleason score: 9 Histologic grading system: 5 grade system Number of biopsy cores examined: 12 Number of biopsy cores positive: 12   12/15/2022 PET scan    IMPRESSION: 1. Prostatic primary or primaries with nodal metastasis within the chest, abdomen, and pelvis. 2. The hepatic lesion on prior diagnostic CT is not tracer avid, arguing against metastatic disease. Likely an incidental benign lesion. 3. Widespread osseous metastasis.     12/24/2022 Initial Diagnosis   Malignant neoplasm of prostate   01/06/2023 -  Chemotherapy   Patient is on Treatment Plan :  PROSTATE Docetaxel (75) q21d        INTERVAL HISTORY:  Timothy Rivera is here for a follow up of   Prostate Cancer . He was last seen by me on 01/27/2023. He presents to the clinic accompanied by family. Pt state that he had one day of fatigue after last cycle of chemo. Pt state that he had some hot flashes. Pt over all is clinical doing well. Pt denies having and numbness and tingling and diarrhea.     All other systems were reviewed with the patient and are negative.  MEDICAL HISTORY:  Past Medical History:  Diagnosis Date   Azoospermia non-obstructive 2021   Diabetes mellitus without complication (HCC)    Elevated PSA 11/05/2022   10/09/2022   Gross hematuria 11/12/2022   11/05/2022, 10/09/2022   Torn Achilles tendon     SURGICAL HISTORY: Past Surgical History:  Procedure Laterality Date   IR IMAGING GUIDED PORT INSERTION  12/31/2022   LAPAROSCOPIC INGUINAL HERNIA REPAIR  1991   PROSTATE BIOPSY      I have reviewed the social history and family history with the patient and they are unchanged from previous note.  ALLERGIES:  has No Known Allergies.  MEDICATIONS:  Current Outpatient Medications  Medication Sig Dispense Refill   abiraterone acetate (ZYTIGA) 250 MG tablet Take 4 tablets (1,000 mg total) by mouth daily. Take on an empty stomach 1 hour before or 2 hours after a meal 120 tablet 2   Accu-Chek FastClix Lancets MISC as directed for 90 days     Blood Glucose Monitoring Suppl (ACCU-CHEK AVIVA PLUS) w/Device KIT as directed for 90 days     dexamethasone (DECADRON) 4 MG tablet Take 2 tabs by mouth 2 times daily starting day before chemo. Then take 2 tabs daily for 2 days starting day after chemo. Take with food. 30 tablet 1   Glucose Blood (BLOOD GLUCOSE TEST STRIPS 333) STRP as directed In Vitro once a day for 90 days     lidocaine-prilocaine (EMLA) cream Apply 1 Application topically as needed. To port area before port access 30 g 2   lidocaine-prilocaine (EMLA) cream Apply to affected area once 30 g 3   metFORMIN (GLUCOPHAGE) 1000 MG tablet Take 1,000 mg by mouth 2  (two) times daily with a meal.     milk thistle 175 MG tablet Take 175 mg by mouth daily.     ondansetron (ZOFRAN) 8 MG tablet Take 1 tablet (8 mg total) by mouth every 8 (eight) hours as needed for nausea or vomiting. 30 tablet 1   pioglitazone (ACTOS) 30 MG tablet 1 tablet Orally Once a day for 90 days     predniSONE (DELTASONE) 5 MG tablet Take 1 tablet (5 mg total) by mouth daily with breakfast. 30 tablet 3   prochlorperazine (COMPAZINE) 10 MG tablet Take 1 tablet (10 mg total) by mouth every 6 (six) hours as needed for nausea or vomiting. 30 tablet 1   No current facility-administered medications for this visit.   Facility-Administered Medications Ordered in Other Visits  Medication Dose Route Frequency Provider Last Rate Last Admin   sodium chloride flush (NS) 0.9 % injection 10 mL  10 mL Intracatheter PRN Malachy Mood, MD   10 mL at 02/17/23 1229    PHYSICAL EXAMINATION: ECOG PERFORMANCE STATUS: 0 - Asymptomatic  Vitals:   02/17/23 0933  BP: (!) 138/93  Pulse: 88  Resp: 18  Temp: 98.2 F (36.8 C)  SpO2: 100%   Wt Readings  from Last 3 Encounters:  02/17/23 258 lb 11.2 oz (117.3 kg)  01/27/23 263 lb 14.4 oz (119.7 kg)  01/12/23 264 lb 8 oz (120 kg)     GENERAL:alert, no distress and comfortable SKIN: skin color normal, no rashes or significant lesions EYES: normal, Conjunctiva are pink and non-injected, sclera clear  NEURO: alert & oriented x 3 with fluent speech  LABORATORY DATA:  I have reviewed the data as listed    Latest Ref Rng & Units 02/17/2023    9:10 AM 01/27/2023    9:59 AM 01/20/2023    9:14 AM  CBC  WBC 4.0 - 10.5 K/uL 6.5  7.8  4.8   Hemoglobin 13.0 - 17.0 g/dL 16.1  09.6  04.5   Hematocrit 39.0 - 52.0 % 32.1  33.2  32.7   Platelets 150 - 400 K/uL 239  208  223         Latest Ref Rng & Units 02/17/2023    9:10 AM 01/27/2023    9:59 AM 01/20/2023    9:14 AM  CMP  Glucose 70 - 99 mg/dL 409  811  914   BUN 6 - 20 mg/dL 23  20  19    Creatinine 0.61 - 1.24  mg/dL 7.82  9.56  2.13   Sodium 135 - 145 mmol/L 139  139  139   Potassium 3.5 - 5.1 mmol/L 4.1  4.1  3.9   Chloride 98 - 111 mmol/L 104  103  104   CO2 22 - 32 mmol/L 28  30  29    Calcium 8.9 - 10.3 mg/dL 9.3  9.0  9.0   Total Protein 6.5 - 8.1 g/dL 6.9  6.7  6.8   Total Bilirubin 0.3 - 1.2 mg/dL 0.5  0.4  0.4   Alkaline Phos 38 - 126 U/L 324  1,157  1,372   AST 15 - 41 U/L 10  11  10    ALT 0 - 44 U/L 9  9  9        RADIOGRAPHIC STUDIES: I have personally reviewed the radiological images as listed and agreed with the findings in the report. No results found.    No orders of the defined types were placed in this encounter.  All questions were answered. The patient knows to call the clinic with any problems, questions or concerns. No barriers to learning was detected. The total time spent in the appointment was 25 minutes.     Malachy Mood, MD 02/17/2023   Carolin Coy, CMA, am acting as scribe for Malachy Mood, MD.   I have reviewed the above documentation for accuracy and completeness, and I agree with the above.

## 2023-02-17 NOTE — Patient Instructions (Signed)
[facility]  Discharge Instructions: Thank you for choosing [facility] to provide your oncology and hematology care.   If you have a lab appointment with the Cancer Center, please go directly to the Cancer Center and check in at the registration area.   Wear comfortable clothing and clothing appropriate for easy access to any Portacath or PICC line.   We strive to give you quality time with your provider. You may need to reschedule your appointment if you arrive late (15 or more minutes).  Arriving late affects you and other patients whose appointments are after yours.  Also, if you miss three or more appointments without notifying the office, you may be dismissed from the clinic at the provider's discretion.      For prescription refill requests, have your pharmacy contact our office and allow 72 hours for refills to be completed.    Today you received the following chemotherapy and/or immunotherapy agents: docetaxel      To help prevent nausea and vomiting after your treatment, we encourage you to take your nausea medication as directed.  BELOW ARE SYMPTOMS THAT SHOULD BE REPORTED IMMEDIATELY: *FEVER GREATER THAN 100.4 F (38 C) OR HIGHER *CHILLS OR SWEATING *NAUSEA AND VOMITING THAT IS NOT CONTROLLED WITH YOUR NAUSEA MEDICATION *UNUSUAL SHORTNESS OF BREATH *UNUSUAL BRUISING OR BLEEDING *URINARY PROBLEMS (pain or burning when urinating, or frequent urination) *BOWEL PROBLEMS (unusual diarrhea, constipation, pain near the anus) TENDERNESS IN MOUTH AND THROAT WITH OR WITHOUT PRESENCE OF ULCERS (sore throat, sores in mouth, or a toothache) UNUSUAL RASH, SWELLING OR PAIN  UNUSUAL VAGINAL DISCHARGE OR ITCHING   Items with * indicate a potential emergency and should be followed up as soon as possible or go to the Emergency Department if any problems should occur.  Please show the CHEMOTHERAPY ALERT CARD or IMMUNOTHERAPY ALERT CARD at  check-in to the Emergency Department and triage nurse.  Should you have questions after your visit or need to cancel or reschedule your appointment, please contact [facility]  Dept: [phone]  and follow the prompts.  Office hours are 8:00 a.m. to 4:30 p.m. Monday - Friday. Please note that voicemails left after 4:00 p.m. may not be returned until the following business day.  We are closed weekends and major holidays. You have access to a nurse at all times for urgent questions. Please call the main number to the clinic Dept: [phone] and follow the prompts.   For any non-urgent questions, you may also contact your provider using MyChart. We now offer e-Visits for anyone 18 and older to request care online for non-urgent symptoms. For details visit [email or web address].   Also download the MyChart app! Go to the app store, search "MyChart", open the app, select [facility], and log in with your MyChart username and password.   

## 2023-02-18 ENCOUNTER — Other Ambulatory Visit: Payer: Self-pay

## 2023-02-18 LAB — PROSTATE-SPECIFIC AG, SERUM (LABCORP): Prostate Specific Ag, Serum: 0.3 ng/mL (ref 0.0–4.0)

## 2023-02-19 ENCOUNTER — Ambulatory Visit: Payer: 59

## 2023-02-23 ENCOUNTER — Telehealth: Payer: Self-pay

## 2023-02-23 NOTE — Telephone Encounter (Signed)
Pt called in stating that he has came down with a cold. His symptoms are running nose, nasal congestion, sneezing. He has no fever or body aches. Pt states that his symptoms started Saturday. I asked the pt did he take a covid test and he said no. RN advise the pt to take Mucinex,Robitussin for cough, Tylenol for fever if one comes on and cough drops.

## 2023-03-07 NOTE — Progress Notes (Unsigned)
Patient Care Team: Orpha Bur, MD as PCP - General (Family Medicine) Malachy Mood, MD as Consulting Physician (Hematology and Oncology) Despina Arias, MD as Referring Physician (Urology) Christena Deem, MD as Consulting Physician (Sports Medicine) Bjorn Pippin, MD as Consulting Physician (Orthopedic Surgery)   CHIEF COMPLAINT: Follow up metastatic Prostate cancer   Oncology History Overview Note   Cancer Staging  Malignant neoplasm of prostate Staging form: Prostate, AJCC 8th Edition - Clinical stage from 11/05/2022: Stage IVB (cT1c, cN1, pM1c, PSA: 162, Grade Group: 5) - Signed by Marcello Fennel, PA-C on 12/24/2022 Histopathologic type: Adenocarcinoma, NOS Stage prefix: Initial diagnosis Prostate specific antigen (PSA) range: 20 or greater Gleason primary pattern: 4 Gleason secondary pattern: 5 Gleason score: 9 Histologic grading system: 5 grade system Number of biopsy cores examined: 12 Number of biopsy cores positive: 12     Malignant neoplasm of prostate (HCC)  11/05/2022 Cancer Staging   Staging form: Prostate, AJCC 8th Edition - Clinical stage from 11/05/2022: Stage IVB (cT1c, cN1, pM1c, PSA: 162, Grade Group: 5) - Signed by Marcello Fennel, PA-C on 12/24/2022 Histopathologic type: Adenocarcinoma, NOS Stage prefix: Initial diagnosis Prostate specific antigen (PSA) range: 20 or greater Gleason primary pattern: 4 Gleason secondary pattern: 5 Gleason score: 9 Histologic grading system: 5 grade system Number of biopsy cores examined: 12 Number of biopsy cores positive: 12   12/15/2022 PET scan    IMPRESSION: 1. Prostatic primary or primaries with nodal metastasis within the chest, abdomen, and pelvis. 2. The hepatic lesion on prior diagnostic CT is not tracer avid, arguing against metastatic disease. Likely an incidental benign lesion. 3. Widespread osseous metastasis.     12/24/2022 Initial Diagnosis   Malignant neoplasm of prostate   01/06/2023 -   Chemotherapy   Patient is on Treatment Plan : PROSTATE Docetaxel (75) q21d        CURRENT THERAPY: Triple therapy starting 12/2022 ADT Firmagon starting 12/30/22 Zytiga and prednisone starting 01/02/23 Docetaxel q21 days starting 01/06/23   INTERVAL HISTORY Timothy Rivera returns for follow up and treatment as scheduled. Last seen by Dr. Mosetta Putt 02/17/23 with C3 Taxotere    ROS   Past Medical History:  Diagnosis Date   Azoospermia non-obstructive 2021   Diabetes mellitus without complication (HCC)    Elevated PSA 11/05/2022   10/09/2022   Gross hematuria 11/12/2022   11/05/2022, 10/09/2022   Torn Achilles tendon      Past Surgical History:  Procedure Laterality Date   IR IMAGING GUIDED PORT INSERTION  12/31/2022   LAPAROSCOPIC INGUINAL HERNIA REPAIR  1991   PROSTATE BIOPSY       Outpatient Encounter Medications as of 03/10/2023  Medication Sig   abiraterone acetate (ZYTIGA) 250 MG tablet Take 4 tablets (1,000 mg total) by mouth daily. Take on an empty stomach 1 hour before or 2 hours after a meal   Accu-Chek FastClix Lancets MISC as directed for 90 days   Blood Glucose Monitoring Suppl (ACCU-CHEK AVIVA PLUS) w/Device KIT as directed for 90 days   dexamethasone (DECADRON) 4 MG tablet Take 2 tabs by mouth 2 times daily starting day before chemo. Then take 2 tabs daily for 2 days starting day after chemo. Take with food.   Glucose Blood (BLOOD GLUCOSE TEST STRIPS 333) STRP as directed In Vitro once a day for 90 days   lidocaine-prilocaine (EMLA) cream Apply 1 Application topically as needed. To port area before port access   lidocaine-prilocaine (EMLA) cream Apply to affected area once  metFORMIN (GLUCOPHAGE) 1000 MG tablet Take 1,000 mg by mouth 2 (two) times daily with a meal.   milk thistle 175 MG tablet Take 175 mg by mouth daily.   ondansetron (ZOFRAN) 8 MG tablet Take 1 tablet (8 mg total) by mouth every 8 (eight) hours as needed for nausea or vomiting.   pioglitazone (ACTOS) 30 MG  tablet 1 tablet Orally Once a day for 90 days   predniSONE (DELTASONE) 5 MG tablet Take 1 tablet (5 mg total) by mouth daily with breakfast.   prochlorperazine (COMPAZINE) 10 MG tablet Take 1 tablet (10 mg total) by mouth every 6 (six) hours as needed for nausea or vomiting.   No facility-administered encounter medications on file as of 03/10/2023.     There were no vitals filed for this visit. There is no height or weight on file to calculate BMI.   PHYSICAL EXAM GENERAL:alert, no distress and comfortable SKIN: no rash  EYES: sclera clear NECK: without mass LYMPH:  no palpable cervical or supraclavicular lymphadenopathy  LUNGS: clear with normal breathing effort HEART: regular rate & rhythm, no lower extremity edema ABDOMEN: abdomen soft, non-tender and normal bowel sounds NEURO: alert & oriented x 3 with fluent speech, no focal motor/sensory deficits Breast exam:  PAC without erythema    CBC    Component Value Date/Time   WBC 6.5 02/17/2023 0910   RBC 4.20 (L) 02/17/2023 0910   HGB 10.8 (L) 02/17/2023 0910   HCT 32.1 (L) 02/17/2023 0910   PLT 239 02/17/2023 0910   MCV 76.4 (L) 02/17/2023 0910   MCH 25.7 (L) 02/17/2023 0910   MCHC 33.6 02/17/2023 0910   RDW 15.9 (H) 02/17/2023 0910   LYMPHSABS 1.9 02/17/2023 0910   MONOABS 0.8 02/17/2023 0910   EOSABS 0.1 02/17/2023 0910   BASOSABS 0.1 02/17/2023 0910     CMP     Component Value Date/Time   NA 139 02/17/2023 0910   K 4.1 02/17/2023 0910   CL 104 02/17/2023 0910   CO2 28 02/17/2023 0910   GLUCOSE 135 (H) 02/17/2023 0910   BUN 23 (H) 02/17/2023 0910   CREATININE 1.15 02/17/2023 0910   CALCIUM 9.3 02/17/2023 0910   PROT 6.9 02/17/2023 0910   ALBUMIN 4.3 02/17/2023 0910   AST 10 (L) 02/17/2023 0910   ALT 9 02/17/2023 0910   ALKPHOS 324 (H) 02/17/2023 0910   BILITOT 0.5 02/17/2023 0910   GFRNONAA >60 02/17/2023 0910     ASSESSMENT & PLAN:Timothy Rivera is a 42 y.o. 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 -Unfortunately the PSMA scan showed diffuse bone metastasis, in spine, pelvic, ribs, humerus and femurs, he also has diffuse hypermetabolic thoracic, abdominal and pelvic adenopathy.  The liver lesion was not hypermetabolic, so unlikely related to his prostate cancer. -Due to the high disease burden, Dr. Mosetta Putt recommended triple therapy with androgen deprivation (starting 12/30/22), Zytiga and prednisone (starting 01/02/23), and chemotherapy docetaxel q21d (starting 01/06/23). Greggory Keen was denied as prophylaxis       PLAN:  No orders of the defined types were placed in this encounter.     All questions were answered. The patient knows to call the clinic with any problems, questions or concerns. No barriers to learning were detected. I spent *** counseling the  patient face to face. The total time spent in the appointment was *** and more than 50% was on counseling, review of test results, and coordination of care.   Santiago Glad, NP-C @DATE @

## 2023-03-09 MED FILL — Dexamethasone Sodium Phosphate Inj 100 MG/10ML: INTRAMUSCULAR | Qty: 1 | Status: AC

## 2023-03-10 ENCOUNTER — Inpatient Hospital Stay (HOSPITAL_BASED_OUTPATIENT_CLINIC_OR_DEPARTMENT_OTHER): Payer: 59 | Admitting: Nurse Practitioner

## 2023-03-10 ENCOUNTER — Encounter: Payer: Self-pay | Admitting: Nurse Practitioner

## 2023-03-10 ENCOUNTER — Inpatient Hospital Stay: Payer: 59

## 2023-03-10 ENCOUNTER — Other Ambulatory Visit: Payer: Self-pay

## 2023-03-10 VITALS — BP 136/92 | HR 93 | Resp 17

## 2023-03-10 DIAGNOSIS — C61 Malignant neoplasm of prostate: Secondary | ICD-10-CM

## 2023-03-10 DIAGNOSIS — Z95828 Presence of other vascular implants and grafts: Secondary | ICD-10-CM

## 2023-03-10 DIAGNOSIS — Z5111 Encounter for antineoplastic chemotherapy: Secondary | ICD-10-CM | POA: Diagnosis not present

## 2023-03-10 LAB — CBC WITH DIFFERENTIAL (CANCER CENTER ONLY)
Abs Immature Granulocytes: 0.03 10*3/uL (ref 0.00–0.07)
Basophils Absolute: 0.1 10*3/uL (ref 0.0–0.1)
Basophils Relative: 1 %
Eosinophils Absolute: 0.1 10*3/uL (ref 0.0–0.5)
Eosinophils Relative: 1 %
HCT: 31.8 % — ABNORMAL LOW (ref 39.0–52.0)
Hemoglobin: 10.3 g/dL — ABNORMAL LOW (ref 13.0–17.0)
Immature Granulocytes: 1 %
Lymphocytes Relative: 27 %
Lymphs Abs: 1.7 10*3/uL (ref 0.7–4.0)
MCH: 25.2 pg — ABNORMAL LOW (ref 26.0–34.0)
MCHC: 32.4 g/dL (ref 30.0–36.0)
MCV: 77.8 fL — ABNORMAL LOW (ref 80.0–100.0)
Monocytes Absolute: 0.9 10*3/uL (ref 0.1–1.0)
Monocytes Relative: 15 %
Neutro Abs: 3.5 10*3/uL (ref 1.7–7.7)
Neutrophils Relative %: 55 %
Platelet Count: 236 10*3/uL (ref 150–400)
RBC: 4.09 MIL/uL — ABNORMAL LOW (ref 4.22–5.81)
RDW: 16.7 % — ABNORMAL HIGH (ref 11.5–15.5)
WBC Count: 6.2 10*3/uL (ref 4.0–10.5)
nRBC: 0 % (ref 0.0–0.2)

## 2023-03-10 LAB — CMP (CANCER CENTER ONLY)
ALT: 14 U/L (ref 0–44)
AST: 13 U/L — ABNORMAL LOW (ref 15–41)
Albumin: 3.9 g/dL (ref 3.5–5.0)
Alkaline Phosphatase: 126 U/L (ref 38–126)
Anion gap: 6 (ref 5–15)
BUN: 22 mg/dL — ABNORMAL HIGH (ref 6–20)
CO2: 28 mmol/L (ref 22–32)
Calcium: 9.3 mg/dL (ref 8.9–10.3)
Chloride: 105 mmol/L (ref 98–111)
Creatinine: 1.06 mg/dL (ref 0.61–1.24)
GFR, Estimated: >60 mL/min (ref >60)
Glucose, Bld: 122 mg/dL — ABNORMAL HIGH (ref 70–99)
Potassium: 4 mmol/L (ref 3.5–5.1)
Sodium: 139 mmol/L (ref 135–145)
Total Bilirubin: 0.5 mg/dL (ref 0.3–1.2)
Total Protein: 6.8 g/dL (ref 6.5–8.1)

## 2023-03-10 MED ORDER — SODIUM CHLORIDE 0.9% FLUSH
10.0000 mL | INTRAVENOUS | Status: DC | PRN
  Administered 2023-03-10: 10 mL

## 2023-03-10 MED ORDER — PREDNISONE 5 MG PO TABS: 5.0000 mg | ORAL_TABLET | Freq: Every day | ORAL | 3 refills | Status: DC

## 2023-03-10 MED ORDER — PALONOSETRON HCL INJECTION 0.25 MG/5ML
0.2500 mg | Freq: Once | INTRAVENOUS | Status: AC
  Administered 2023-03-10: 0.25 mg via INTRAVENOUS
  Filled 2023-03-10: qty 5

## 2023-03-10 MED ORDER — HEPARIN SOD (PORK) LOCK FLUSH 100 UNIT/ML IV SOLN
500.0000 [IU] | Freq: Once | INTRAVENOUS | Status: AC | PRN
  Administered 2023-03-10: 500 [IU]

## 2023-03-10 MED ORDER — SODIUM CHLORIDE 0.9 % IV SOLN
75.0000 mg/m2 | Freq: Once | INTRAVENOUS | Status: AC
  Administered 2023-03-10: 189 mg via INTRAVENOUS
  Filled 2023-03-10: qty 18.9

## 2023-03-10 MED ORDER — SODIUM CHLORIDE 0.9% FLUSH
10.0000 mL | INTRAVENOUS | Status: DC | PRN
  Administered 2023-03-10: 10 mL via INTRAVENOUS

## 2023-03-10 MED ORDER — SODIUM CHLORIDE 0.9 % IV SOLN
10.0000 mg | Freq: Once | INTRAVENOUS | Status: AC
  Administered 2023-03-10: 10 mg via INTRAVENOUS
  Filled 2023-03-10: qty 10

## 2023-03-10 MED ORDER — SODIUM CHLORIDE 0.9 % IV SOLN: Freq: Once | INTRAVENOUS | Status: AC

## 2023-03-10 NOTE — Patient Instructions (Signed)
[facility]  Discharge Instructions: Thank you for choosing [facility] to provide your oncology and hematology care.   If you have a lab appointment with the Cancer Center, please go directly to the Cancer Center and check in at the registration area.   Wear comfortable clothing and clothing appropriate for easy access to any Portacath or PICC line.   We strive to give you quality time with your provider. You may need to reschedule your appointment if you arrive late (15 or more minutes).  Arriving late affects you and other patients whose appointments are after yours.  Also, if you miss three or more appointments without notifying the office, you may be dismissed from the clinic at the provider's discretion.      For prescription refill requests, have your pharmacy contact our office and allow 72 hours for refills to be completed.    Today you received the following chemotherapy and/or immunotherapy agents Docetaxel.   To help prevent nausea and vomiting after your treatment, we encourage you to take your nausea medication as directed.  BELOW ARE SYMPTOMS THAT SHOULD BE REPORTED IMMEDIATELY: *FEVER GREATER THAN 100.4 F (38 C) OR HIGHER *CHILLS OR SWEATING *NAUSEA AND VOMITING THAT IS NOT CONTROLLED WITH YOUR NAUSEA MEDICATION *UNUSUAL SHORTNESS OF BREATH *UNUSUAL BRUISING OR BLEEDING *URINARY PROBLEMS (pain or burning when urinating, or frequent urination) *BOWEL PROBLEMS (unusual diarrhea, constipation, pain near the anus) TENDERNESS IN MOUTH AND THROAT WITH OR WITHOUT PRESENCE OF ULCERS (sore throat, sores in mouth, or a toothache) UNUSUAL RASH, SWELLING OR PAIN  UNUSUAL VAGINAL DISCHARGE OR ITCHING   Items with * indicate a potential emergency and should be followed up as soon as possible or go to the Emergency Department if any problems should occur.  Please show the CHEMOTHERAPY ALERT CARD or IMMUNOTHERAPY ALERT CARD at check-in  to the Emergency Department and triage nurse.  Should you have questions after your visit or need to cancel or reschedule your appointment, please contact [facility]  Dept: [phone]  and follow the prompts.  Office hours are 8:00 a.m. to 4:30 p.m. Monday - Friday. Please note that voicemails left after 4:00 p.m. may not be returned until the following business day.  We are closed weekends and major holidays. You have access to a nurse at all times for urgent questions. Please call the main number to the clinic Dept: [phone] and follow the prompts.   For any non-urgent questions, you may also contact your provider using MyChart. We now offer e-Visits for anyone 18 and older to request care online for non-urgent symptoms. For details visit [email or web address].   Also download the MyChart app! Go to the app store, search "MyChart", open the app, select , and log in with your MyChart username and password.   

## 2023-03-11 LAB — PROSTATE-SPECIFIC AG, SERUM (LABCORP): Prostate Specific Ag, Serum: <0.1 ng/mL (ref 0.0–4.0)

## 2023-03-24 ENCOUNTER — Other Ambulatory Visit: Payer: Self-pay | Admitting: Hematology

## 2023-03-24 ENCOUNTER — Other Ambulatory Visit: Payer: Self-pay

## 2023-03-24 DIAGNOSIS — C61 Malignant neoplasm of prostate: Secondary | ICD-10-CM

## 2023-03-24 MED ORDER — ABIRATERONE ACETATE 250 MG PO TABS: 1000.0000 mg | ORAL_TABLET | Freq: Every day | ORAL | 2 refills | Status: DC

## 2023-03-26 NOTE — Progress Notes (Signed)
RN spoke with patient to assess any needs or concerns while undergoing his chemotherapy treatment. Pt denies any needs or concerns at this time.

## 2023-03-30 DIAGNOSIS — C7951 Secondary malignant neoplasm of bone: Secondary | ICD-10-CM | POA: Insufficient documentation

## 2023-03-30 NOTE — Assessment & Plan Note (Signed)
-  from prostate cancer -I recommend zometa infusion every 3 months, pending creatinine and show side effects discussed with patient, especially the risk of jaw necrosis.  I will recommend him to get a clearance from [facility]. -Continue calcium and vitamin D supplement

## 2023-03-30 NOTE — Assessment & Plan Note (Signed)
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 -Unfortunately the PSMA scan showed diffuse bone metastasis, in spine, pelvic, ribs, humerus and femurs, he also has diffuse hypermetabolic adenopathy in thoracic, abdomen and pelvis.  The liver lesion was not hypermetabolic, so unlikely related to his prostate cancer. --Due to the high disease burden, I recommend triple therapy with androgen deprivation, Zytiga and prednisone, and chemotherapy docetaxel every 3 weeks for 6 cycles.  -he has received first ADT Firmagon on 12/30/22, and started Zytiga and prednisone on 01/02/2023 -He started chemo Docetaxel on 01/06/2023, he tolerated first cycle well except a few days still fatigue.  He has no urinary symptoms and hip pain has improved since he started treatment, PSA has dropped, indicating good response to systemic treatment.

## 2023-03-30 NOTE — Progress Notes (Unsigned)
[facility]   [phone] Fax:([phone]) [phone]   Clinic Follow up Note   Patient Care Team: Orpha Bur, MD as PCP - General (Family Medicine) Malachy Mood, MD as Consulting Physician (Hematology and Oncology) Despina Arias, MD as Referring Physician (Urology) Christena Deem, MD (Inactive) as Consulting Physician (Sports Medicine) Bjorn Pippin, MD as Consulting Physician (Orthopedic Surgery)  Date of Service:  03/31/2023  CHIEF COMPLAINT: f/u of  metastatic Prostate cancer     CURRENT THERAPY:  Triple therapy starting 12/2022 ADT Firmagon q4 mo starting 12/30/22 Zytiga and prednisone daily starting 01/02/23 Docetaxel q21 days starting 01/06/23   ASSESSMENT:  Timothy Rivera is a 42 y.o. 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 -Unfortunately the PSMA scan showed diffuse bone metastasis, in spine, pelvic, ribs, humerus and femurs, he also has diffuse hypermetabolic adenopathy in thoracic, abdomen and pelvis.  The liver lesion was not hypermetabolic, so unlikely related to his prostate cancer. --Due to the high disease burden, I recommend triple therapy with androgen deprivation, Zytiga and prednisone, and chemotherapy docetaxel every 3 weeks for 6 cycles.  -he has received first ADT Firmagon on 12/30/22, and started Zytiga and prednisone on 01/02/2023 -He started chemo Docetaxel on 01/06/2023, he tolerated first cycle well except a few days still fatigue.  He has no urinary symptoms and hip pain has improved since he started treatment, PSA has dropped, indicating good response to systemic treatment.  Metastasis to bone (HCC) -from prostate cancer -I recommend zometa infusion every 3 months, pending creatinine and show  side effects discussed with patient, especially the risk of jaw necrosis.  I will recommend him to get a clearance from [facility]. -Continue calcium and vitamin D supplement     PLAN: -lab reviewed. -CMP pending -PSA less than 0.1 3 weeks ago, reviewed  -proceed with treatment if CMP is adequate -Continue ADT and Zytiga after chemo -lab/flush and f/u in 3 weeks before last dose chemo    SUMMARY OF ONCOLOGIC HISTORY: Oncology History Overview Note   Cancer Staging  Malignant neoplasm of prostate Staging form: Prostate, AJCC 8th Edition - Clinical stage from 11/05/2022: Stage IVB (cT1c, cN1, pM1c, PSA: 162, Grade Group: 5) - Signed by Marcello Fennel, PA-C on 12/24/2022 Histopathologic type: Adenocarcinoma, NOS Stage prefix: Initial diagnosis Prostate specific antigen (PSA) range: 20 or greater Gleason primary pattern: 4 Gleason secondary pattern: 5 Gleason score: 9 Histologic grading system: 5 grade system Number of biopsy cores examined: 12 Number of biopsy cores positive: 12     Malignant neoplasm of prostate (HCC)  11/05/2022 Cancer Staging   Staging form: Prostate, AJCC 8th Edition - Clinical stage from 11/05/2022: Stage IVB (cT1c, cN1, pM1c, PSA: 162, Grade Group: 5) - Signed by Marcello Fennel, PA-C on 12/24/2022 Histopathologic type: Adenocarcinoma, NOS Stage prefix: Initial diagnosis Prostate specific antigen (PSA) range: 20 or greater Gleason primary pattern: 4 Gleason secondary pattern: 5 Gleason score: 9 Histologic grading system: 5 grade system Number of biopsy cores examined: 12 Number of biopsy cores positive: 12   12/15/2022 PET scan    IMPRESSION: 1. Prostatic primary or primaries with nodal metastasis within the chest, abdomen, and pelvis. 2. The hepatic lesion on prior diagnostic CT is not tracer avid, arguing against metastatic disease. Likely  an incidental benign lesion. 3. Widespread osseous metastasis.     12/24/2022 Initial Diagnosis    Malignant neoplasm of prostate   01/06/2023 -  Chemotherapy   Patient is on Treatment Plan : PROSTATE Docetaxel (75) q21d        INTERVAL HISTORY:  Timothy Rivera is here for a follow up of   metastatic Prostate cancer .He was last seen by NP Lacie on 03/10/2023. He presents to the clinic  accompanied by wife. Pt state that he felt like he was coming down with a cold. Pt denies having any other issues with treatment.    All other systems were reviewed with the patient and are negative.  MEDICAL HISTORY:  Past Medical History:  Diagnosis Date   Azoospermia non-obstructive 2021   Diabetes mellitus without complication (HCC)    Elevated PSA 11/05/2022   10/09/2022   Gross hematuria 11/12/2022   11/05/2022, 10/09/2022   Torn Achilles tendon     SURGICAL HISTORY: Past Surgical History:  Procedure Laterality Date   IR IMAGING GUIDED PORT INSERTION  12/31/2022   LAPAROSCOPIC INGUINAL HERNIA REPAIR  1991   PROSTATE BIOPSY      I have reviewed the social history and family history with the patient and they are unchanged from previous note.  ALLERGIES:  has No Known Allergies.  MEDICATIONS:  Current Outpatient Medications  Medication Sig Dispense Refill   abiraterone acetate (ZYTIGA) 250 MG tablet Take 4 tablets (1,000 mg total) by mouth daily. Take on an empty stomach 1 hour before or 2 hours after a meal 120 tablet 2   Accu-Chek FastClix Lancets MISC as directed for 90 days     Blood Glucose Monitoring Suppl (ACCU-CHEK AVIVA PLUS) w/Device KIT as directed for 90 days     Glucose Blood (BLOOD GLUCOSE TEST STRIPS 333) STRP as directed In Vitro once a day for 90 days     lidocaine-prilocaine (EMLA) cream Apply 1 Application topically as needed. To port area before port access 30 g 2   lidocaine-prilocaine (EMLA) cream Apply to affected area once 30 g 3   metFORMIN (GLUCOPHAGE) 1000 MG tablet Take 1,000 mg by mouth 2 (two) times daily with a meal.     milk thistle 175 MG tablet Take 175  mg by mouth daily.     ondansetron (ZOFRAN) 8 MG tablet Take 1 tablet (8 mg total) by mouth every 8 (eight) hours as needed for nausea or vomiting. 30 tablet 1   pioglitazone (ACTOS) 30 MG tablet 1 tablet Orally Once a day for 90 days     predniSONE (DELTASONE) 5 MG tablet Take 1 tablet (5 mg total) by mouth daily with breakfast. 30 tablet 3   prochlorperazine (COMPAZINE) 10 MG tablet Take 1 tablet (10 mg total) by mouth every 6 (six) hours as needed for nausea or vomiting. 30 tablet 1   No current facility-administered medications for this visit.   Facility-Administered Medications Ordered in Other Visits  Medication Dose Route Frequency Provider Last Rate Last Admin   sodium chloride flush (NS) 0.9 % injection 10 mL  10 mL Intracatheter PRN Malachy Mood, MD   10 mL at 03/31/23 1236    PHYSICAL EXAMINATION: ECOG PERFORMANCE STATUS: 1 - Symptomatic but completely ambulatory  Vitals:   03/31/23 0937  BP: (!) 130/91  Pulse: 84  Resp: 18  Temp: 98.3 F (36.8 C)  SpO2: 100%   Wt Readings from Last 3 Encounters:  03/31/23 255 lb 4.8 oz (115.8 kg)  03/10/23  258 lb 6.4 oz (117.2 kg)  02/17/23 258 lb 11.2 oz (117.3 kg)     GENERAL:alert, no distress and comfortable SKIN: skin color normal, no rashes or significant lesions EYES: normal, Conjunctiva are pink and non-injected, sclera clear  NEURO: alert & oriented x 3 with fluent speech  LABORATORY DATA:  I have reviewed the data as listed    Latest Ref Rng & Units 03/31/2023    9:19 AM 03/10/2023    8:22 AM 02/17/2023    9:10 AM  CBC  WBC 4.0 - 10.5 K/uL 5.5  6.2  6.5   Hemoglobin 13.0 - 17.0 g/dL 16.1  09.6  04.5   Hematocrit 39.0 - 52.0 % 32.6  31.8  32.1   Platelets 150 - 400 K/uL 242  236  239         Latest Ref Rng & Units 03/31/2023    9:19 AM 03/10/2023    8:22 AM 02/17/2023    9:10 AM  CMP  Glucose 70 - 99 mg/dL 409  811  914   BUN 6 - 20 mg/dL 21  22  23    Creatinine 0.61 - 1.24 mg/dL 7.82  9.56  2.13   Sodium 135 -  145 mmol/L 140  139  139   Potassium 3.5 - 5.1 mmol/L 3.9  4.0  4.1   Chloride 98 - 111 mmol/L 106  105  104   CO2 22 - 32 mmol/L 28  28  28    Calcium 8.9 - 10.3 mg/dL 9.6  9.3  9.3   Total Protein 6.5 - 8.1 g/dL 6.8  6.8  6.9   Total Bilirubin 0.3 - 1.2 mg/dL 0.5  0.5  0.5   Alkaline Phos 38 - 126 U/L 77  126  324   AST 15 - 41 U/L 10  13  10    ALT 0 - 44 U/L 10  14  9        RADIOGRAPHIC STUDIES: I have personally reviewed the radiological images as listed and agreed with the findings in the report. No results found.    No orders of the defined types were placed in this encounter.  All questions were answered. The patient knows to call the clinic with any problems, questions or concerns. No barriers to learning was detected. The total time spent in the appointment was 25 minutes.     Malachy Mood, MD 03/31/2023   Carolin Coy, CMA, am acting as scribe for Malachy Mood, MD.   I have reviewed the above documentation for accuracy and completeness, and I agree with the above.

## 2023-03-31 ENCOUNTER — Other Ambulatory Visit: Payer: Self-pay

## 2023-03-31 ENCOUNTER — Inpatient Hospital Stay (HOSPITAL_BASED_OUTPATIENT_CLINIC_OR_DEPARTMENT_OTHER): Payer: 59 | Admitting: Hematology

## 2023-03-31 ENCOUNTER — Encounter: Payer: Self-pay | Admitting: Hematology

## 2023-03-31 ENCOUNTER — Inpatient Hospital Stay: Payer: 59

## 2023-03-31 ENCOUNTER — Inpatient Hospital Stay: Payer: 59 | Attending: Hematology

## 2023-03-31 VITALS — BP 118/70 | HR 68 | Temp 98.0°F | Resp 18

## 2023-03-31 VITALS — BP 130/91 | HR 84 | Temp 98.3°F | Resp 18 | Ht 74.0 in | Wt 255.3 lb

## 2023-03-31 DIAGNOSIS — C7951 Secondary malignant neoplasm of bone: Secondary | ICD-10-CM

## 2023-03-31 DIAGNOSIS — Z95828 Presence of other vascular implants and grafts: Secondary | ICD-10-CM

## 2023-03-31 DIAGNOSIS — C61 Malignant neoplasm of prostate: Secondary | ICD-10-CM | POA: Insufficient documentation

## 2023-03-31 DIAGNOSIS — Z5111 Encounter for antineoplastic chemotherapy: Secondary | ICD-10-CM | POA: Insufficient documentation

## 2023-03-31 LAB — CMP (CANCER CENTER ONLY)
ALT: 10 U/L (ref 0–44)
AST: 10 U/L — ABNORMAL LOW (ref 15–41)
Albumin: 4.2 g/dL (ref 3.5–5.0)
Alkaline Phosphatase: 77 U/L (ref 38–126)
Anion gap: 6 (ref 5–15)
BUN: 21 mg/dL — ABNORMAL HIGH (ref 6–20)
CO2: 28 mmol/L (ref 22–32)
Calcium: 9.6 mg/dL (ref 8.9–10.3)
Chloride: 106 mmol/L (ref 98–111)
Creatinine: 1 mg/dL (ref 0.61–1.24)
GFR, Estimated: >60 mL/min (ref >60)
Glucose, Bld: 108 mg/dL — ABNORMAL HIGH (ref 70–99)
Potassium: 3.9 mmol/L (ref 3.5–5.1)
Sodium: 140 mmol/L (ref 135–145)
Total Bilirubin: 0.5 mg/dL (ref 0.3–1.2)
Total Protein: 6.8 g/dL (ref 6.5–8.1)

## 2023-03-31 LAB — CBC WITH DIFFERENTIAL (CANCER CENTER ONLY)
Abs Immature Granulocytes: 0.01 10*3/uL (ref 0.00–0.07)
Basophils Absolute: 0.1 10*3/uL (ref 0.0–0.1)
Basophils Relative: 2 %
Eosinophils Absolute: 0.1 10*3/uL (ref 0.0–0.5)
Eosinophils Relative: 2 %
HCT: 32.6 % — ABNORMAL LOW (ref 39.0–52.0)
Hemoglobin: 10.8 g/dL — ABNORMAL LOW (ref 13.0–17.0)
Immature Granulocytes: 0 %
Lymphocytes Relative: 32 %
Lymphs Abs: 1.8 10*3/uL (ref 0.7–4.0)
MCH: 25.6 pg — ABNORMAL LOW (ref 26.0–34.0)
MCHC: 33.1 g/dL (ref 30.0–36.0)
MCV: 77.3 fL — ABNORMAL LOW (ref 80.0–100.0)
Monocytes Absolute: 0.7 10*3/uL (ref 0.1–1.0)
Monocytes Relative: 13 %
Neutro Abs: 2.8 10*3/uL (ref 1.7–7.7)
Neutrophils Relative %: 51 %
Platelet Count: 242 10*3/uL (ref 150–400)
RBC: 4.22 MIL/uL (ref 4.22–5.81)
RDW: 16.4 % — ABNORMAL HIGH (ref 11.5–15.5)
WBC Count: 5.5 10*3/uL (ref 4.0–10.5)
nRBC: 0 % (ref 0.0–0.2)

## 2023-03-31 MED ORDER — SODIUM CHLORIDE 0.9 % IV SOLN
75.0000 mg/m2 | Freq: Once | INTRAVENOUS | Status: AC
  Administered 2023-03-31: 189 mg via INTRAVENOUS
  Filled 2023-03-31: qty 18.9

## 2023-03-31 MED ORDER — SODIUM CHLORIDE 0.9 % IV SOLN
10.0000 mg | Freq: Once | INTRAVENOUS | Status: AC
  Administered 2023-03-31: 10 mg via INTRAVENOUS
  Filled 2023-03-31: qty 10

## 2023-03-31 MED ORDER — SODIUM CHLORIDE 0.9% FLUSH
10.0000 mL | INTRAVENOUS | Status: DC | PRN
  Administered 2023-03-31: 10 mL via INTRAVENOUS

## 2023-03-31 MED ORDER — SODIUM CHLORIDE 0.9 % IV SOLN: Freq: Once | INTRAVENOUS | Status: AC

## 2023-03-31 MED ORDER — SODIUM CHLORIDE 0.9% FLUSH
10.0000 mL | INTRAVENOUS | Status: DC | PRN
  Administered 2023-03-31: 10 mL

## 2023-03-31 MED ORDER — HEPARIN SOD (PORK) LOCK FLUSH 100 UNIT/ML IV SOLN
500.0000 [IU] | Freq: Once | INTRAVENOUS | Status: AC | PRN
  Administered 2023-03-31: 500 [IU]

## 2023-03-31 MED ORDER — PALONOSETRON HCL INJECTION 0.25 MG/5ML
0.2500 mg | Freq: Once | INTRAVENOUS | Status: AC
  Administered 2023-03-31: 0.25 mg via INTRAVENOUS
  Filled 2023-03-31: qty 5

## 2023-03-31 NOTE — Patient Instructions (Signed)
[facility]  Discharge Instructions: Thank you for choosing [facility] to provide your oncology and hematology care.   If you have a lab appointment with the Cancer Center, please go directly to the Cancer Center and check in at the registration area.   Wear comfortable clothing and clothing appropriate for easy access to any Portacath or PICC line.   We strive to give you quality time with your provider. You may need to reschedule your appointment if you arrive late (15 or more minutes).  Arriving late affects you and other patients whose appointments are after yours.  Also, if you miss three or more appointments without notifying the office, you may be dismissed from the clinic at the provider's discretion.      For prescription refill requests, have your pharmacy contact our office and allow 72 hours for refills to be completed.    Today you received the following chemotherapy and/or immunotherapy agents Paclitaxel      To help prevent nausea and vomiting after your treatment, we encourage you to take your nausea medication as directed.  BELOW ARE SYMPTOMS THAT SHOULD BE REPORTED IMMEDIATELY: *FEVER GREATER THAN 100.4 F (38 C) OR HIGHER *CHILLS OR SWEATING *NAUSEA AND VOMITING THAT IS NOT CONTROLLED WITH YOUR NAUSEA MEDICATION *UNUSUAL SHORTNESS OF BREATH *UNUSUAL BRUISING OR BLEEDING *URINARY PROBLEMS (pain or burning when urinating, or frequent urination) *BOWEL PROBLEMS (unusual diarrhea, constipation, pain near the anus) TENDERNESS IN MOUTH AND THROAT WITH OR WITHOUT PRESENCE OF ULCERS (sore throat, sores in mouth, or a toothache) UNUSUAL RASH, SWELLING OR PAIN  UNUSUAL VAGINAL DISCHARGE OR ITCHING   Items with * indicate a potential emergency and should be followed up as soon as possible or go to the Emergency Department if any problems should occur.  Please show the CHEMOTHERAPY ALERT CARD or IMMUNOTHERAPY ALERT CARD at  check-in to the Emergency Department and triage nurse.  Should you have questions after your visit or need to cancel or reschedule your appointment, please contact [facility]  Dept: [phone]  and follow the prompts.  Office hours are 8:00 a.m. to 4:30 p.m. Monday - Friday. Please note that voicemails left after 4:00 p.m. may not be returned until the following business day.  We are closed weekends and major holidays. You have access to a nurse at all times for urgent questions. Please call the main number to the clinic Dept: [phone] and follow the prompts.   For any non-urgent questions, you may also contact your provider using MyChart. We now offer e-Visits for anyone 18 and older to request care online for non-urgent symptoms. For details visit [email or web address].   Also download the MyChart app! Go to the app store, search "MyChart", open the app, select [place], and log in with your MyChart username and password.   

## 2023-04-01 LAB — PROSTATE-SPECIFIC AG, SERUM (LABCORP): Prostate Specific Ag, Serum: <0.1 ng/mL (ref 0.0–4.0)

## 2023-04-11 ENCOUNTER — Other Ambulatory Visit: Payer: Self-pay

## 2023-04-20 MED FILL — Dexamethasone Sodium Phosphate Inj 100 MG/10ML: INTRAMUSCULAR | Qty: 1 | Status: AC

## 2023-04-20 NOTE — Progress Notes (Unsigned)
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 -Unfortunately the PSMA scan showed diffuse bone metastasis, in spine, pelvic, ribs, humerus and femurs, he also has diffuse hypermetabolic adenopathy in thoracic, abdomen and pelvis.  The liver lesion was not hypermetabolic, so unlikely related to his prostate cancer. --Due to the high disease burden, I recommend triple therapy with androgen deprivation, Zytiga and prednisone, and chemotherapy docetaxel every 3 weeks for 6 cycles.  -he has received first ADT Firmagon on 12/30/22, and started Zytiga and prednisone on 01/02/2023 -He started chemo Docetaxel on 01/06/2023, he tolerated first cycle well except a few days still fatigue.  He has no urinary symptoms and hip pain has improved since he started treatment, PSA has dropped, indicating good response to systemic treatment. Today, 04/21/2023 - presents for C6 (final) docetaxol.  -he feels well. Appetite is good. Gets fatiged around day 3 after chemotherapy.. reports this lasts for 2 to 3 days and then resolves. Has had intermittent diarrhea. Imodium is effective. Has occasional hot flashes, but denies night sweats.  --CBC showing Hgb 10.9; Hct 33.0; plt 232l and anc 3.6. Bun 21; creatinine 1.14; eGFR >60; LFTs normal. Awaiting today's PSA.  --most recent  PSA done 03/31/2023 was < 0.1 Next dose firmagon due 05/2023  Metastasis to bone (HCC) -from prostate cancer -I recommend zometa infusion every 3 months,side effects discussed with patient, especially the risk of jaw necrosis.  -?clearance from dentist -Continue calcium and vitamin D supplement  Plan -  -review labs  -Cycle 6 docetaxol today  -continue daily zytiga and prednisone -?initiation of zometa  -conitnue calcium with vitamin d daily  -flush/PSA with follow up and firmagon in September.     The patient understands the plans discussed today and is in agreement with them.  He knows to contact our office if he develops concerns prior to his next appointment.  I provided 30 minutes of face-to-face time during this encounter and > 50% was spent counseling as documented under my assessment and plan.    Carlean Jews, NP  [facility] [address] [place] [address] Dept: [phone] Dept Fax: [phone]   No orders of the defined types were placed in this encounter.     CHIEF COMPLAINT:  CC: metastatic prostate cancer   Current Treatment:  triple therapy  ADT Firmagon q4 mo starting 12/30/22 Zytiga and prednisone daily starting 01/02/23 Docetaxel q21 days starting 01/06/23  INTERVAL HISTORY:  Breeze is here today for repeat clinical assessment. He denies fevers or chills. He denies pain. His appetite is good. His weight has been stable.  Metastatic Prostate Cancer  Good response to therapy with most recent PSA 03/31/2023 < 0.1.  -has tolerated Docetaxol. Today 04/21/2023 is final recommended dose. He would like to ring the [facility]" to signify the end of his chemotherapy journey.  -continue  Firmagon inj. Every 3 months with port flush and check of PSA  -continue zytiga and prednisone daily  -?initiation of zometa for bone health. -review labs today. CBC is stable and slightly improved.  Awaiting results of CMP and PSA   I have reviewed the past medical history, past surgical history, social history and family history with the patient and they are unchanged from previous note.  ALLERGIES:  has No Known Allergies.  MEDICATIONS:  Current Outpatient Medications  Medication Sig Dispense Refill   abiraterone acetate (ZYTIGA) 250 MG tablet Take 4 tablets (1,000 mg total) by mouth daily. Take on an empty stomach 1 hour before or 2 hours after a meal 120 tablet 2   Accu-Chek FastClix Lancets MISC as directed for 90 days     Blood Glucose Monitoring Suppl (ACCU-CHEK AVIVA PLUS) w/Device KIT as directed for 90 days     Glucose Blood (BLOOD GLUCOSE TEST STRIPS 333) STRP as directed In Vitro once a day for 90 days     lidocaine-prilocaine (EMLA) cream Apply 1 Application topically as needed. To port area before port access 30 g 2   lidocaine-prilocaine (EMLA) cream Apply to affected area once 30 g 3   metFORMIN (GLUCOPHAGE) 1000 MG tablet Take 1,000 mg by mouth 2 (two) times daily with a meal.     milk thistle 175 MG tablet Take 175 mg by mouth daily.     ondansetron (ZOFRAN) 8 MG tablet Take 1 tablet (8 mg total) by mouth every 8 (eight) hours as needed for nausea or vomiting. 30 tablet 1   pioglitazone (ACTOS) 30 MG tablet 1 tablet Orally Once a day for 90 days     predniSONE (DELTASONE) 5 MG tablet Take 1 tablet (5 mg total) by mouth daily with breakfast. 30 tablet 3   prochlorperazine (COMPAZINE) 10 MG tablet Take 1 tablet (10 mg total) by mouth every 6 (six) hours as needed for nausea or vomiting. 30 tablet 1   No current facility-administered medications for this visit.   Facility-Administered Medications Ordered in Other Visits  Medication Dose Route Frequency Provider Last Rate Last Admin   dexamethasone (DECADRON) 10 mg in sodium chloride 0.9 % 50 mL IVPB  10 mg Intravenous Once Malachy Mood, MD       DOCEtaxel (TAXOTERE) 189 mg in sodium chloride 0.9 % 500 mL chemo infusion  75  mg/m2 (Treatment Plan Recorded) Intravenous Once Malachy Mood, MD       heparin lock flush 100 unit/mL  500 Units Intracatheter Once PRN Malachy Mood, MD       sodium chloride flush (NS) 0.9 % injection 10 mL  10 mL Intracatheter PRN Malachy Mood, MD        HISTORY OF PRESENT ILLNESS:   Oncology History Overview Note   Cancer Staging  Malignant neoplasm of prostate Staging form: Prostate, AJCC 8th Edition - Clinical stage from 11/05/2022: Stage IVB (cT1c, cN1, pM1c, PSA: 162, Grade Group: 5) - Signed by Marcello Fennel, PA-C on 12/24/2022 Histopathologic type: Adenocarcinoma, NOS Stage prefix: Initial diagnosis Prostate specific antigen (PSA) range: 20 or greater Gleason primary pattern: 4 Gleason secondary pattern: 5 Gleason score: 9 Histologic grading system: 5 grade system Number of biopsy cores examined: 12 Number of biopsy cores positive: 12     Malignant neoplasm of prostate (HCC)  11/05/2022 Cancer Staging   Staging form: Prostate, AJCC 8th Edition - Clinical stage from 11/05/2022: Stage IVB (cT1c, cN1, pM1c, PSA: 162, Grade Group: 5) - Signed by Barbee Shropshire,  [place], PA-C on 12/24/2022 Histopathologic type: Adenocarcinoma, NOS Stage prefix: Initial diagnosis Prostate specific antigen (PSA) range: 20 or greater Gleason primary pattern: 4 Gleason secondary pattern: 5 Gleason score: 9 Histologic grading system: 5 grade system Number of biopsy cores examined: 12 Number of biopsy cores positive: 12   12/15/2022 PET scan    IMPRESSION: 1. Prostatic primary or primaries with nodal metastasis within the chest, abdomen, and pelvis. 2. The hepatic lesion on prior diagnostic CT is not tracer avid, arguing against metastatic disease. Likely an incidental benign lesion. 3. Widespread osseous metastasis.     12/24/2022 Initial Diagnosis   Malignant neoplasm of prostate   01/06/2023 -  Chemotherapy   Patient is on Treatment Plan : PROSTATE Docetaxel (75) q21d         REVIEW OF SYSTEMS:    Constitutional: Denies fevers, chills or abnormal weight loss. Does have some fatigue which starts about 3 days after chemotherapy and lasts for 2 to 3 days.  Eyes: Denies blurriness of vision Ears, nose, mouth, throat, and face: Denies mucositis or sore throat Respiratory: Denies cough, dyspnea or wheezes Cardiovascular: Denies palpitation, chest discomfort or lower extremity swelling Gastrointestinal:  Denies nausea, heartburn or change in bowel habits. Intermittent diarrhea following chemotherapy. Controlled with imodium.  Skin: Denies abnormal skin rashes Lymphatics: Denies new lymphadenopathy or easy bruising Neurological:Denies numbness, tingling or new weaknesses Behavioral/Psych: Mood is stable, no new changes  All other systems were reviewed with the patient and are negative.   VITALS:   Today's Vitals   04/21/23 0912  BP: 138/87  Pulse: 77  Resp: 17  Temp: 98.1 F (36.7 C)  TempSrc: Oral  SpO2: 98%  Weight: 256 lb 3.2 oz (116.2 kg)   Body mass index is 32.89 kg/m.   Wt Readings from Last 3 Encounters:  04/21/23 256 lb 3.2 oz (116.2 kg)  03/31/23 255 lb 4.8 oz (115.8 kg)  03/10/23 258 lb 6.4 oz (117.2 kg)    Body mass index is 32.89 kg/m.  Performance status (ECOG): 0 - Asymptomatic  PHYSICAL EXAM:   GENERAL:alert, no distress and comfortable SKIN: skin color, texture, turgor are normal, no rashes or significant lesions EYES: normal, Conjunctiva are pink and non-injected, sclera clear OROPHARYNX:no exudate, no erythema and lips, buccal mucosa, and tongue normal  NECK: supple, thyroid normal size, non-tender, without nodularity LYMPH:  no palpable lymphadenopathy in the cervical, axillary or inguinal LUNGS: clear to auscultation and percussion with normal breathing effort HEART: regular rate & rhythm and no murmurs and no lower extremity edema ABDOMEN:abdomen soft, non-tender and normal bowel sounds Musculoskeletal:no cyanosis of digits and no clubbing   NEURO: alert & oriented x 3 with fluent speech, no focal motor/sensory deficits  LABORATORY DATA:  I have reviewed the data as listed    Component Value Date/Time   NA 140 04/21/2023 0843   K 3.9 04/21/2023 0843   CL 106 04/21/2023 0843   CO2 27 04/21/2023 0843   GLUCOSE 122 (H) 04/21/2023 0843   BUN 21 (H) 04/21/2023 0843   CREATININE 1.14 04/21/2023 0843   CALCIUM 9.1 04/21/2023 0843   PROT 6.7 04/21/2023 0843   ALBUMIN 4.1 04/21/2023 0843   AST 17 04/21/2023 0843   ALT 23 04/21/2023 0843   ALKPHOS 61 04/21/2023 0843   BILITOT 0.4 04/21/2023 0843   GFRNONAA >60 04/21/2023 0843   Lab Results  Component Value Date   WBC 6.2 04/21/2023   NEUTROABS 3.6 04/21/2023   HGB 10.9 (L) 04/21/2023  HCT 33.0 (L) 04/21/2023   MCV 78.0 (L) 04/21/2023   PLT 232 04/21/2023   Lab Results  Component Value Date   PSA1 <0.1 03/31/2023   PSA1 <0.1 03/10/2023   PSA1 0.3 02/17/2023      Plan -  -review labs  -Cycle 6 docetaxol today  -continue daily zytiga and prednisone -?initiation of zometa  -conitnue calcium with vitamin d daily  -port flush/PSA check with follow up and Firmagon injection in 05/2023  Addendum I have seen the patient, examined him. I agree with the assessment and and plan and have edited the notes.   Mr. Schwantz is doing well and tolerating chemotherapy well.  This is the last cycle chemo with scheduled.  He has had excellent response to treatment, his PSA has been less than 0.1 the past few months.  He is tolerating Zytiga well.  Lab reviewed, adequate for treatment, will proceed last cycle docetaxel today.  He will continue Zytiga and prednisone, Eligard is due in mid September 2024.  Will see him back with the injection.  We discussed port removal, he agrees to keep it for the next 6 months to see how things go.  Malachy Mood MD 04/21/2023

## 2023-04-20 NOTE — Assessment & Plan Note (Addendum)
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 -Unfortunately the PSMA scan showed diffuse bone metastasis, in spine, pelvic, ribs, humerus and femurs, he also has diffuse hypermetabolic adenopathy in thoracic, abdomen and pelvis.  The liver lesion was not hypermetabolic, so unlikely related to his prostate cancer. --Due to the high disease burden, I recommend triple therapy with androgen deprivation, Zytiga and prednisone, and chemotherapy docetaxel every 3 weeks for 6 cycles.  -he has received first ADT Firmagon on 12/30/22, and started Zytiga and prednisone on 01/02/2023 -He started chemo Docetaxel on 01/06/2023, he tolerated first cycle well except a few days still fatigue.  He has no urinary symptoms and hip pain has improved since he started treatment, PSA has dropped, indicating good response to systemic treatment. Today, 04/21/2023 - presents for C6 docetaxol  Next dose firmagon [place] due 05/2023

## 2023-04-20 NOTE — Assessment & Plan Note (Signed)
-  from prostate cancer -I recommend zometa infusion every 3 months,side effects discussed with patient, especially the risk of jaw necrosis.  -?clearance from dentist -Continue calcium and vitamin D supplement

## 2023-04-21 ENCOUNTER — Inpatient Hospital Stay: Payer: 59 | Attending: Hematology

## 2023-04-21 ENCOUNTER — Other Ambulatory Visit: Payer: Self-pay

## 2023-04-21 ENCOUNTER — Encounter: Payer: Self-pay | Admitting: Nurse Practitioner

## 2023-04-21 ENCOUNTER — Inpatient Hospital Stay: Payer: 59

## 2023-04-21 ENCOUNTER — Inpatient Hospital Stay (HOSPITAL_BASED_OUTPATIENT_CLINIC_OR_DEPARTMENT_OTHER): Payer: 59 | Admitting: Nurse Practitioner

## 2023-04-21 VITALS — BP 138/87 | HR 77 | Temp 98.1°F | Resp 17 | Wt 256.2 lb

## 2023-04-21 DIAGNOSIS — C61 Malignant neoplasm of prostate: Secondary | ICD-10-CM | POA: Insufficient documentation

## 2023-04-21 DIAGNOSIS — Z95828 Presence of other vascular implants and grafts: Secondary | ICD-10-CM

## 2023-04-21 DIAGNOSIS — C7951 Secondary malignant neoplasm of bone: Secondary | ICD-10-CM | POA: Insufficient documentation

## 2023-04-21 DIAGNOSIS — Z5111 Encounter for antineoplastic chemotherapy: Secondary | ICD-10-CM | POA: Diagnosis present

## 2023-04-21 LAB — CBC WITH DIFFERENTIAL (CANCER CENTER ONLY)
Abs Immature Granulocytes: 0.01 10*3/uL (ref 0.00–0.07)
Basophils Absolute: 0.1 10*3/uL (ref 0.0–0.1)
Basophils Relative: 1 %
Eosinophils Absolute: 0.1 10*3/uL (ref 0.0–0.5)
Eosinophils Relative: 2 %
HCT: 33 % — ABNORMAL LOW (ref 39.0–52.0)
Hemoglobin: 10.9 g/dL — ABNORMAL LOW (ref 13.0–17.0)
Immature Granulocytes: 0 %
Lymphocytes Relative: 27 %
Lymphs Abs: 1.6 10*3/uL (ref 0.7–4.0)
MCH: 25.8 pg — ABNORMAL LOW (ref 26.0–34.0)
MCHC: 33 g/dL (ref 30.0–36.0)
MCV: 78 fL — ABNORMAL LOW (ref 80.0–100.0)
Monocytes Absolute: 0.7 10*3/uL (ref 0.1–1.0)
Monocytes Relative: 11 %
Neutro Abs: 3.6 10*3/uL (ref 1.7–7.7)
Neutrophils Relative %: 59 %
Platelet Count: 232 10*3/uL (ref 150–400)
RBC: 4.23 MIL/uL (ref 4.22–5.81)
RDW: 16.1 % — ABNORMAL HIGH (ref 11.5–15.5)
WBC Count: 6.2 10*3/uL (ref 4.0–10.5)
nRBC: 0 % (ref 0.0–0.2)

## 2023-04-21 LAB — CMP (CANCER CENTER ONLY)
ALT: 23 U/L (ref 0–44)
AST: 17 U/L (ref 15–41)
Albumin: 4.1 g/dL (ref 3.5–5.0)
Alkaline Phosphatase: 61 U/L (ref 38–126)
Anion gap: 7 (ref 5–15)
BUN: 21 mg/dL — ABNORMAL HIGH (ref 6–20)
CO2: 27 mmol/L (ref 22–32)
Calcium: 9.1 mg/dL (ref 8.9–10.3)
Chloride: 106 mmol/L (ref 98–111)
Creatinine: 1.14 mg/dL (ref 0.61–1.24)
GFR, Estimated: >60 mL/min (ref >60)
Glucose, Bld: 122 mg/dL — ABNORMAL HIGH (ref 70–99)
Potassium: 3.9 mmol/L (ref 3.5–5.1)
Sodium: 140 mmol/L (ref 135–145)
Total Bilirubin: 0.4 mg/dL (ref 0.3–1.2)
Total Protein: 6.7 g/dL (ref 6.5–8.1)

## 2023-04-21 MED ORDER — SODIUM CHLORIDE 0.9 % IV SOLN: Freq: Once | INTRAVENOUS | Status: AC

## 2023-04-21 MED ORDER — PALONOSETRON HCL INJECTION 0.25 MG/5ML
0.2500 mg | Freq: Once | INTRAVENOUS | Status: AC
  Administered 2023-04-21: 0.25 mg via INTRAVENOUS
  Filled 2023-04-21: qty 5

## 2023-04-21 MED ORDER — SODIUM CHLORIDE 0.9% FLUSH
10.0000 mL | INTRAVENOUS | Status: DC | PRN
  Administered 2023-04-21: 10 mL via INTRAVENOUS

## 2023-04-21 MED ORDER — SODIUM CHLORIDE 0.9% FLUSH
10.0000 mL | INTRAVENOUS | Status: DC | PRN
  Administered 2023-04-21: 10 mL

## 2023-04-21 MED ORDER — HEPARIN SOD (PORK) LOCK FLUSH 100 UNIT/ML IV SOLN
500.0000 [IU] | Freq: Once | INTRAVENOUS | Status: AC | PRN
  Administered 2023-04-21: 500 [IU]

## 2023-04-21 MED ORDER — SODIUM CHLORIDE 0.9 % IV SOLN
10.0000 mg | Freq: Once | INTRAVENOUS | Status: AC
  Administered 2023-04-21: 10 mg via INTRAVENOUS
  Filled 2023-04-21: qty 10

## 2023-04-21 MED ORDER — SODIUM CHLORIDE 0.9 % IV SOLN
75.0000 mg/m2 | Freq: Once | INTRAVENOUS | Status: AC
  Administered 2023-04-21: 189 mg via INTRAVENOUS
  Filled 2023-04-21: qty 18.9

## 2023-04-21 NOTE — Patient Instructions (Signed)
[facility]  Discharge Instructions: Thank you for choosing [facility] to provide your oncology and hematology care.   If you have a lab appointment with the Cancer Center, please go directly to the Cancer Center and check in at the registration area.   Wear comfortable clothing and clothing appropriate for easy access to any Portacath or PICC line.   We strive to give you quality time with your provider. You may need to reschedule your appointment if you arrive late (15 or more minutes).  Arriving late affects you and other patients whose appointments are after yours.  Also, if you miss three or more appointments without notifying the office, you may be dismissed from the clinic at the provider's discretion.      For prescription refill requests, have your pharmacy contact our office and allow 72 hours for refills to be completed.    Today you received the following chemotherapy and/or immunotherapy agents :  Docetaxel      To help prevent nausea and vomiting after your treatment, we encourage you to take your nausea medication as directed.  BELOW ARE SYMPTOMS THAT SHOULD BE REPORTED IMMEDIATELY: *FEVER GREATER THAN 100.4 F (38 C) OR HIGHER *CHILLS OR SWEATING *NAUSEA AND VOMITING THAT IS NOT CONTROLLED WITH YOUR NAUSEA MEDICATION *UNUSUAL SHORTNESS OF BREATH *UNUSUAL BRUISING OR BLEEDING *URINARY PROBLEMS (pain or burning when urinating, or frequent urination) *BOWEL PROBLEMS (unusual diarrhea, constipation, pain near the anus) TENDERNESS IN MOUTH AND THROAT WITH OR WITHOUT PRESENCE OF ULCERS (sore throat, sores in mouth, or a toothache) UNUSUAL RASH, SWELLING OR PAIN  UNUSUAL VAGINAL DISCHARGE OR ITCHING   Items with * indicate a potential emergency and should be followed up as soon as possible or go to the Emergency Department if any problems should occur.  Please show the CHEMOTHERAPY ALERT CARD or IMMUNOTHERAPY ALERT CARD at  check-in to the Emergency Department and triage nurse.  Should you have questions after your visit or need to cancel or reschedule your appointment, please contact [facility]  Dept: [phone]  and follow the prompts.  Office hours are 8:00 a.m. to 4:30 p.m. Monday - Friday. Please note that voicemails left after 4:00 p.m. may not be returned until the following business day.  We are closed weekends and major holidays. You have access to a nurse at all times for urgent questions. Please call the main number to the clinic Dept: [phone] and follow the prompts.   For any non-urgent questions, you may also contact your provider using MyChart. We now offer e-Visits for anyone 18 and older to request care online for non-urgent symptoms. For details visit [email or web address].   Also download the MyChart app! Go to the app store, search "MyChart", open the app, select [place], and log in with your MyChart username and password. 

## 2023-04-23 ENCOUNTER — Telehealth: Payer: Self-pay | Admitting: Hematology

## 2023-04-24 ENCOUNTER — Other Ambulatory Visit: Payer: Self-pay

## 2023-06-02 ENCOUNTER — Other Ambulatory Visit: Payer: 59

## 2023-06-02 ENCOUNTER — Ambulatory Visit: Payer: 59

## 2023-06-02 ENCOUNTER — Ambulatory Visit: Payer: 59 | Admitting: Nurse Practitioner

## 2023-06-03 ENCOUNTER — Inpatient Hospital Stay: Payer: 59

## 2023-06-03 ENCOUNTER — Inpatient Hospital Stay: Payer: 59 | Attending: Hematology

## 2023-06-03 ENCOUNTER — Inpatient Hospital Stay (HOSPITAL_BASED_OUTPATIENT_CLINIC_OR_DEPARTMENT_OTHER): Payer: 59 | Admitting: Physician Assistant

## 2023-06-03 VITALS — BP 141/91 | HR 77 | Temp 97.0°F | Resp 20 | Wt 253.5 lb

## 2023-06-03 DIAGNOSIS — Z95828 Presence of other vascular implants and grafts: Secondary | ICD-10-CM

## 2023-06-03 DIAGNOSIS — C61 Malignant neoplasm of prostate: Secondary | ICD-10-CM

## 2023-06-03 DIAGNOSIS — C7800 Secondary malignant neoplasm of unspecified lung: Secondary | ICD-10-CM | POA: Insufficient documentation

## 2023-06-03 DIAGNOSIS — C7951 Secondary malignant neoplasm of bone: Secondary | ICD-10-CM | POA: Insufficient documentation

## 2023-06-03 DIAGNOSIS — Z5111 Encounter for antineoplastic chemotherapy: Secondary | ICD-10-CM | POA: Insufficient documentation

## 2023-06-03 LAB — CMP (CANCER CENTER ONLY)
ALT: 8 U/L (ref 0–44)
AST: 10 U/L — ABNORMAL LOW (ref 15–41)
Albumin: 4.1 g/dL (ref 3.5–5.0)
Alkaline Phosphatase: 61 U/L (ref 38–126)
Anion gap: 5 (ref 5–15)
BUN: 17 mg/dL (ref 6–20)
CO2: 29 mmol/L (ref 22–32)
Calcium: 9.4 mg/dL (ref 8.9–10.3)
Chloride: 105 mmol/L (ref 98–111)
Creatinine: 0.95 mg/dL (ref 0.61–1.24)
GFR, Estimated: >60 mL/min (ref >60)
Glucose, Bld: 115 mg/dL — ABNORMAL HIGH (ref 70–99)
Potassium: 3.9 mmol/L (ref 3.5–5.1)
Sodium: 139 mmol/L (ref 135–145)
Total Bilirubin: 0.5 mg/dL (ref 0.3–1.2)
Total Protein: 6.8 g/dL (ref 6.5–8.1)

## 2023-06-03 LAB — CBC WITH DIFFERENTIAL (CANCER CENTER ONLY)
Abs Immature Granulocytes: 0 10*3/uL (ref 0.00–0.07)
Basophils Absolute: 0.1 10*3/uL (ref 0.0–0.1)
Basophils Relative: 2 %
Eosinophils Absolute: 0.3 10*3/uL (ref 0.0–0.5)
Eosinophils Relative: 7 %
HCT: 34.8 % — ABNORMAL LOW (ref 39.0–52.0)
Hemoglobin: 11.4 g/dL — ABNORMAL LOW (ref 13.0–17.0)
Immature Granulocytes: 0 %
Lymphocytes Relative: 33 %
Lymphs Abs: 1.5 10*3/uL (ref 0.7–4.0)
MCH: 25.6 pg — ABNORMAL LOW (ref 26.0–34.0)
MCHC: 32.8 g/dL (ref 30.0–36.0)
MCV: 78 fL — ABNORMAL LOW (ref 80.0–100.0)
Monocytes Absolute: 0.5 10*3/uL (ref 0.1–1.0)
Monocytes Relative: 11 %
Neutro Abs: 2.1 10*3/uL (ref 1.7–7.7)
Neutrophils Relative %: 47 %
Platelet Count: 206 10*3/uL (ref 150–400)
RBC: 4.46 MIL/uL (ref 4.22–5.81)
RDW: 15.4 % (ref 11.5–15.5)
WBC Count: 4.5 10*3/uL (ref 4.0–10.5)
nRBC: 0 % (ref 0.0–0.2)

## 2023-06-03 MED ORDER — HEPARIN SOD (PORK) LOCK FLUSH 100 UNIT/ML IV SOLN
500.0000 [IU] | Freq: Once | INTRAVENOUS | Status: AC
  Administered 2023-06-03: 500 [IU] via INTRAVENOUS

## 2023-06-03 MED ORDER — SODIUM CHLORIDE 0.9% FLUSH
10.0000 mL | INTRAVENOUS | Status: DC | PRN
  Administered 2023-06-03: 10 mL via INTRAVENOUS

## 2023-06-03 MED ORDER — LEUPROLIDE ACETATE (4 MONTH) 30 MG ~~LOC~~ KIT
30.0000 mg | PACK | Freq: Once | SUBCUTANEOUS | Status: AC
  Administered 2023-06-03: 30 mg via SUBCUTANEOUS
  Filled 2023-06-03: qty 30

## 2023-06-03 NOTE — Progress Notes (Signed)
Patient Care Team: Orpha Bur, MD as PCP - General (Family Medicine) Malachy Mood, MD as Consulting Physician (Hematology and Oncology) Despina Arias, MD as Referring Physician (Urology) Christena Deem, MD (Inactive) as Consulting Physician (Sports Medicine) Bjorn Pippin, MD as Consulting Physician (Orthopedic Surgery)   CHIEF COMPLAINT: Follow up metastatic Prostate cancer   Oncology History Overview Note   Cancer Staging  Malignant neoplasm of prostate Staging form: Prostate, AJCC 8th Edition - Clinical stage from 11/05/2022: Stage IVB (cT1c, cN1, pM1c, PSA: 162, Grade Group: 5) - Signed by Marcello Fennel, PA-C on 12/24/2022 Histopathologic type: Adenocarcinoma, NOS Stage prefix: Initial diagnosis Prostate specific antigen (PSA) range: 20 or greater Gleason primary pattern: 4 Gleason secondary pattern: 5 Gleason score: 9 Histologic grading system: 5 grade system Number of biopsy cores examined: 12 Number of biopsy cores positive: 12     Malignant neoplasm of prostate (HCC)  11/05/2022 Cancer Staging   Staging form: Prostate, AJCC 8th Edition - Clinical stage from 11/05/2022: Stage IVB (cT1c, cN1, pM1c, PSA: 162, Grade Group: 5) - Signed by Marcello Fennel, PA-C on 12/24/2022 Histopathologic type: Adenocarcinoma, NOS Stage prefix: Initial diagnosis Prostate specific antigen (PSA) range: 20 or greater Gleason primary pattern: 4 Gleason secondary pattern: 5 Gleason score: 9 Histologic grading system: 5 grade system Number of biopsy cores examined: 12 Number of biopsy cores positive: 12   12/15/2022 PET scan    IMPRESSION: 1. Prostatic primary or primaries with nodal metastasis within the chest, abdomen, and pelvis. 2. The hepatic lesion on prior diagnostic CT is not tracer avid, arguing against metastatic disease. Likely an incidental benign lesion. 3. Widespread osseous metastasis.     12/24/2022 Initial Diagnosis   Malignant neoplasm of prostate   01/06/2023  -  Chemotherapy   Patient is on Treatment Plan : PROSTATE Docetaxel (75) q21d       PRIOR THERAPY: Received Docetaxel x 6 doses from 01/06/2023-04/21/2023.  CURRENT THERAPY:  ADT Firmagon q4 mo started 12/30/22 switched to Eligard on 01/27/2023.   Zytiga and prednisone daily started 01/02/23   INTERVAL HISTORY Timothy Rivera returns for follow up for metastatic prostate cancer. He was last seen by Vincent Gros, NP on 04/21/2023. He is accompanied by his wife for this visit.   Timothy Rivera reports he is doing well and back to exercising. He as a good appetite without any significant weight changes. He denies nausea, vomiting or abdominal pain. He reports occasional right hip pain but isn't sure if that is from exercising. The hip pain does not interfere with his gait or ADLs. He denies easy bruising or signs of bleeding. He denies fevers, chills, sweats, shortness of breath, chest pain or cough.  He has no other complaints.   Rest of the review of systems reviewed and negative.  Past Medical History:  Diagnosis Date   Azoospermia non-obstructive 2021   Diabetes mellitus without complication (HCC)    Elevated PSA 11/05/2022   10/09/2022   Gross hematuria 11/12/2022   11/05/2022, 10/09/2022   Torn Achilles tendon      Past Surgical History:  Procedure Laterality Date   IR IMAGING GUIDED PORT INSERTION  12/31/2022   LAPAROSCOPIC INGUINAL HERNIA REPAIR  1991   PROSTATE BIOPSY       Outpatient Encounter Medications as of 06/03/2023  Medication Sig   abiraterone acetate (ZYTIGA) 250 MG tablet Take 4 tablets (1,000 mg total) by mouth daily. Take on an empty stomach 1 hour before or 2 hours after a  meal   Accu-Chek FastClix Lancets MISC as directed for 90 days   Blood Glucose Monitoring Suppl (ACCU-CHEK AVIVA PLUS) w/Device KIT as directed for 90 days   Glucose Blood (BLOOD GLUCOSE TEST STRIPS 333) STRP as directed In Vitro once a day for 90 days   lidocaine-prilocaine (EMLA) cream Apply 1 Application  topically as needed. To port area before port access   lidocaine-prilocaine (EMLA) cream Apply to affected area once   metFORMIN (GLUCOPHAGE) 1000 MG tablet Take 1,000 mg by mouth 2 (two) times daily with a meal.   milk thistle 175 MG tablet Take 175 mg by mouth daily.   ondansetron (ZOFRAN) 8 MG tablet Take 1 tablet (8 mg total) by mouth every 8 (eight) hours as needed for nausea or vomiting.   pioglitazone (ACTOS) 30 MG tablet 1 tablet Orally Once a day for 90 days   predniSONE (DELTASONE) 5 MG tablet Take 1 tablet (5 mg total) by mouth daily with breakfast.   prochlorperazine (COMPAZINE) 10 MG tablet Take 1 tablet (10 mg total) by mouth every 6 (six) hours as needed for nausea or vomiting.   Facility-Administered Encounter Medications as of 06/03/2023  Medication   [DISCONTINUED] sodium chloride flush (NS) 0.9 % injection 10 mL     Today's Vitals   06/03/23 1000 06/03/23 1008  BP: (!) 141/91   Pulse: 77   Resp: 20   Temp: (!) 97 F (36.1 C)   SpO2: 100%   Weight: 253 lb 8 oz (115 kg)   PainSc:  0-No pain   Body mass index is 32.55 kg/m.   PHYSICAL EXAM GENERAL:alert, no distress and comfortable EYES: sclera clear LUNGS: clear with normal breathing effort HEART: regular rate & rhythm, no lower extremity edema NEURO: alert & oriented x 3 with fluent speech, no focal motor/sensory deficits   CBC    Component Value Date/Time   WBC 4.5 06/03/2023 0941   RBC 4.46 06/03/2023 0941   HGB 11.4 (L) 06/03/2023 0941   HCT 34.8 (L) 06/03/2023 0941   PLT 206 06/03/2023 0941   MCV 78.0 (L) 06/03/2023 0941   MCH 25.6 (L) 06/03/2023 0941   MCHC 32.8 06/03/2023 0941   RDW 15.4 06/03/2023 0941   LYMPHSABS 1.5 06/03/2023 0941   MONOABS 0.5 06/03/2023 0941   EOSABS 0.3 06/03/2023 0941   BASOSABS 0.1 06/03/2023 0941     CMP     Component Value Date/Time   NA 139 06/03/2023 0941   K 3.9 06/03/2023 0941   CL 105 06/03/2023 0941   CO2 29 06/03/2023 0941   GLUCOSE 115 (H)  06/03/2023 0941   BUN 17 06/03/2023 0941   CREATININE 0.95 06/03/2023 0941   CALCIUM 9.4 06/03/2023 0941   PROT 6.8 06/03/2023 0941   ALBUMIN 4.1 06/03/2023 0941   AST 10 (L) 06/03/2023 0941   ALT 8 06/03/2023 0941   ALKPHOS 61 06/03/2023 0941   BILITOT 0.5 06/03/2023 0941   GFRNONAA >60 06/03/2023 0941     ASSESSMENT & PLAN:Timothy Rivera is a 42 y.o. male with metastatic prostate cancer.   Malignant neoplasm of prostate; stage IVb with diffuse node and pulm metastasis, PSA 162, Gleason score 4+5=9 -Discovered on PSA screening, patient is asymptomatic except mild right-sided hip/pelvic area pain for a week which is probably related to his bone metastasis. -Unfortunately the PSMA scan showed diffuse bone metastasis, in spine, pelvic, ribs, humerus and femurs, he also has diffuse hypermetabolic thoracic, abdominal and pelvic adenopathy.  The liver lesion was not  hypermetabolic, so unlikely related to his prostate cancer. -Due to the high disease burden, Dr. Mosetta Putt recommended triple therapy with androgen deprivation (started 12/30/22), Zytiga and prednisone (started 01/02/23), and chemotherapy docetaxel q21d (started 01/06/23). Greggory Keen was denied as prophylaxis -Completed 6th and final dose of docetaxel on 04/21/2023.   PLAN: -Labs from today were reviewed and adequate for treatment. WBC 4.5, Hgb 11.4, Plt 206K, creatinine normal, LFTs in range. PSA level pending.  -Prior PSA level from 04/21/2023 ws <0.1.  -Continue with Zytiga plus prednisone therapy -Due for Eligard injection today. Recommend to continue today and every 4 months.  -RTC in 4 months with labs, follow up visit and Eligard injection   All questions were answered. The patient knows to call the clinic with any problems, questions or concerns. No barriers to learning were detected.   I have spent a total of 30 minutes minutes of face-to-face and non-face-to-face time, preparing to see the patient,performing a medically appropriate  examination, counseling and educating the patient, documenting clinical information in the electronic health record, independently interpreting results and communicating results to the patient, and care coordination.   Georga Kaufmann PA-C Dept of Hematology and Oncology [facility] Phone: [phone]

## 2023-06-03 NOTE — Patient Instructions (Signed)

## 2023-06-05 ENCOUNTER — Other Ambulatory Visit: Payer: Self-pay

## 2023-06-05 LAB — PROSTATE-SPECIFIC AG, SERUM (LABCORP): Prostate Specific Ag, Serum: <0.1 ng/mL (ref 0.0–4.0)

## 2023-06-13 ENCOUNTER — Other Ambulatory Visit: Payer: Self-pay | Admitting: Hematology

## 2023-06-19 ENCOUNTER — Other Ambulatory Visit: Payer: Self-pay

## 2023-06-24 ENCOUNTER — Telehealth: Payer: Self-pay | Admitting: Hematology

## 2023-07-14 ENCOUNTER — Inpatient Hospital Stay: Payer: 59 | Attending: Hematology

## 2023-07-14 DIAGNOSIS — Z452 Encounter for adjustment and management of vascular access device: Secondary | ICD-10-CM | POA: Diagnosis present

## 2023-07-14 DIAGNOSIS — Z95828 Presence of other vascular implants and grafts: Secondary | ICD-10-CM

## 2023-07-14 DIAGNOSIS — C61 Malignant neoplasm of prostate: Secondary | ICD-10-CM | POA: Diagnosis not present

## 2023-07-14 MED ORDER — HEPARIN SOD (PORK) LOCK FLUSH 100 UNIT/ML IV SOLN
500.0000 [IU] | Freq: Once | INTRAVENOUS | Status: AC
  Administered 2023-07-14: 500 [IU] via INTRAVENOUS

## 2023-07-14 MED ORDER — SODIUM CHLORIDE 0.9% FLUSH
10.0000 mL | INTRAVENOUS | Status: DC | PRN
  Administered 2023-07-14: 10 mL via INTRAVENOUS

## 2023-07-25 ENCOUNTER — Other Ambulatory Visit: Payer: Self-pay

## 2023-08-18 ENCOUNTER — Inpatient Hospital Stay: Payer: 59 | Attending: Hematology

## 2023-08-18 DIAGNOSIS — C61 Malignant neoplasm of prostate: Secondary | ICD-10-CM | POA: Diagnosis present

## 2023-08-18 DIAGNOSIS — C7951 Secondary malignant neoplasm of bone: Secondary | ICD-10-CM | POA: Insufficient documentation

## 2023-08-18 LAB — CMP (CANCER CENTER ONLY)
ALT: 10 U/L (ref 0–44)
AST: 11 U/L — ABNORMAL LOW (ref 15–41)
Albumin: 4.2 g/dL (ref 3.5–5.0)
Alkaline Phosphatase: 68 U/L (ref 38–126)
Anion gap: 5 (ref 5–15)
BUN: 17 mg/dL (ref 6–20)
CO2: 29 mmol/L (ref 22–32)
Calcium: 9.4 mg/dL (ref 8.9–10.3)
Chloride: 104 mmol/L (ref 98–111)
Creatinine: 0.98 mg/dL (ref 0.61–1.24)
GFR, Estimated: >60 mL/min (ref >60)
Glucose, Bld: 124 mg/dL — ABNORMAL HIGH (ref 70–99)
Potassium: 3.8 mmol/L (ref 3.5–5.1)
Sodium: 138 mmol/L (ref 135–145)
Total Bilirubin: 0.7 mg/dL (ref <1.2)
Total Protein: 6.6 g/dL (ref 6.5–8.1)

## 2023-08-18 LAB — CBC WITH DIFFERENTIAL (CANCER CENTER ONLY)
Abs Immature Granulocytes: 0.01 10*3/uL (ref 0.00–0.07)
Basophils Absolute: 0.1 10*3/uL (ref 0.0–0.1)
Basophils Relative: 1 %
Eosinophils Absolute: 0.2 10*3/uL (ref 0.0–0.5)
Eosinophils Relative: 5 %
HCT: 36.8 % — ABNORMAL LOW (ref 39.0–52.0)
Hemoglobin: 12.2 g/dL — ABNORMAL LOW (ref 13.0–17.0)
Immature Granulocytes: 0 %
Lymphocytes Relative: 40 %
Lymphs Abs: 1.8 10*3/uL (ref 0.7–4.0)
MCH: 25.9 pg — ABNORMAL LOW (ref 26.0–34.0)
MCHC: 33.2 g/dL (ref 30.0–36.0)
MCV: 78.1 fL — ABNORMAL LOW (ref 80.0–100.0)
Monocytes Absolute: 0.5 10*3/uL (ref 0.1–1.0)
Monocytes Relative: 11 %
Neutro Abs: 1.9 10*3/uL (ref 1.7–7.7)
Neutrophils Relative %: 43 %
Platelet Count: 185 10*3/uL (ref 150–400)
RBC: 4.71 MIL/uL (ref 4.22–5.81)
RDW: 15.5 % (ref 11.5–15.5)
WBC Count: 4.6 10*3/uL (ref 4.0–10.5)
nRBC: 0 % (ref 0.0–0.2)

## 2023-08-19 LAB — PROSTATE-SPECIFIC AG, SERUM (LABCORP): Prostate Specific Ag, Serum: <0.1 ng/mL (ref 0.0–4.0)

## 2023-09-05 ENCOUNTER — Other Ambulatory Visit: Payer: Self-pay | Admitting: Hematology

## 2023-09-23 ENCOUNTER — Other Ambulatory Visit: Payer: Self-pay

## 2023-10-05 NOTE — Assessment & Plan Note (Signed)
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 -Unfortunately the PSMA scan showed diffuse bone metastasis, in spine, pelvic, ribs, humerus and femurs, he also has diffuse hypermetabolic adenopathy in thoracic, abdomen and pelvis.  The liver lesion was not hypermetabolic, so unlikely related to his prostate cancer. --Due to the high disease burden, I recommend triple therapy with androgen deprivation, Zytiga and prednisone, and chemotherapy docetaxel every 3 weeks for 6 cycles.  -he has received first ADT Firmagon on 12/30/22, and started Zytiga and prednisone on 01/02/2023 -He started chemo Docetaxel on 01/06/2023, and completed 6 cycles in 04/2023.  He has no urinary symptoms and hip pain has improved since he started treatment, PSA has dropped to undetectable, indicating good response to systemic treatment. -continue Eligard and zytiga/prednisone

## 2023-10-06 ENCOUNTER — Encounter: Payer: Self-pay | Admitting: Hematology

## 2023-10-06 ENCOUNTER — Other Ambulatory Visit: Payer: 59

## 2023-10-06 ENCOUNTER — Inpatient Hospital Stay: Payer: 59 | Attending: Hematology | Admitting: Hematology

## 2023-10-06 ENCOUNTER — Inpatient Hospital Stay: Payer: 59

## 2023-10-06 VITALS — BP 123/91 | HR 86 | Temp 98.5°F | Resp 18

## 2023-10-06 VITALS — BP 134/97 | HR 77 | Temp 98.3°F | Resp 18 | Wt 256.2 lb

## 2023-10-06 DIAGNOSIS — Z5111 Encounter for antineoplastic chemotherapy: Secondary | ICD-10-CM | POA: Diagnosis present

## 2023-10-06 DIAGNOSIS — C61 Malignant neoplasm of prostate: Secondary | ICD-10-CM

## 2023-10-06 DIAGNOSIS — C7951 Secondary malignant neoplasm of bone: Secondary | ICD-10-CM | POA: Diagnosis not present

## 2023-10-06 DIAGNOSIS — Z95828 Presence of other vascular implants and grafts: Secondary | ICD-10-CM

## 2023-10-06 LAB — CBC WITH DIFFERENTIAL (CANCER CENTER ONLY)
Abs Immature Granulocytes: 0.01 10*3/uL (ref 0.00–0.07)
Basophils Absolute: 0.1 10*3/uL (ref 0.0–0.1)
Basophils Relative: 2 %
Eosinophils Absolute: 0.3 10*3/uL (ref 0.0–0.5)
Eosinophils Relative: 7 %
HCT: 36.2 % — ABNORMAL LOW (ref 39.0–52.0)
Hemoglobin: 12.6 g/dL — ABNORMAL LOW (ref 13.0–17.0)
Immature Granulocytes: 0 %
Lymphocytes Relative: 42 %
Lymphs Abs: 1.7 10*3/uL (ref 0.7–4.0)
MCH: 26.5 pg (ref 26.0–34.0)
MCHC: 34.8 g/dL (ref 30.0–36.0)
MCV: 76.2 fL — ABNORMAL LOW (ref 80.0–100.0)
Monocytes Absolute: 0.4 10*3/uL (ref 0.1–1.0)
Monocytes Relative: 11 %
Neutro Abs: 1.5 10*3/uL — ABNORMAL LOW (ref 1.7–7.7)
Neutrophils Relative %: 38 %
Platelet Count: 191 10*3/uL (ref 150–400)
RBC: 4.75 MIL/uL (ref 4.22–5.81)
RDW: 13.6 % (ref 11.5–15.5)
WBC Count: 4 10*3/uL (ref 4.0–10.5)
nRBC: 0 % (ref 0.0–0.2)

## 2023-10-06 LAB — CMP (CANCER CENTER ONLY)
ALT: 12 U/L (ref 0–44)
AST: 13 U/L — ABNORMAL LOW (ref 15–41)
Albumin: 4.1 g/dL (ref 3.5–5.0)
Alkaline Phosphatase: 79 U/L (ref 38–126)
Anion gap: 4 — ABNORMAL LOW (ref 5–15)
BUN: 16 mg/dL (ref 6–20)
CO2: 28 mmol/L (ref 22–32)
Calcium: 9.4 mg/dL (ref 8.9–10.3)
Chloride: 104 mmol/L (ref 98–111)
Creatinine: 0.94 mg/dL (ref 0.61–1.24)
GFR, Estimated: >60 mL/min (ref >60)
Glucose, Bld: 111 mg/dL — ABNORMAL HIGH (ref 70–99)
Potassium: 3.9 mmol/L (ref 3.5–5.1)
Sodium: 136 mmol/L (ref 135–145)
Total Bilirubin: 0.7 mg/dL (ref 0.0–1.2)
Total Protein: 6.7 g/dL (ref 6.5–8.1)

## 2023-10-06 MED ORDER — SODIUM CHLORIDE 0.9% FLUSH
10.0000 mL | INTRAVENOUS | Status: DC | PRN
  Administered 2023-10-06: 10 mL via INTRAVENOUS

## 2023-10-06 MED ORDER — LEUPROLIDE ACETATE (4 MONTH) 30 MG ~~LOC~~ KIT
30.0000 mg | PACK | Freq: Once | SUBCUTANEOUS | Status: AC
Start: 2023-10-06 — End: 2023-10-06
  Administered 2023-10-06: 30 mg via SUBCUTANEOUS
  Filled 2023-10-06: qty 30

## 2023-10-06 MED ORDER — HEPARIN SOD (PORK) LOCK FLUSH 100 UNIT/ML IV SOLN
500.0000 [IU] | Freq: Once | INTRAVENOUS | Status: AC
  Administered 2023-10-06: 500 [IU] via INTRAVENOUS

## 2023-10-06 NOTE — Progress Notes (Signed)
[facility]   [phone] [phone]   Clinic Follow up Note   Patient Care Team: Orpha Bur, MD as PCP - General (Family Medicine) Malachy Mood, MD as Consulting Physician (Hematology and Oncology) Despina Arias, MD as Referring Physician (Urology) Christena Deem, MD (Inactive) as Consulting Physician (Sports Medicine) Bjorn Pippin, MD as Consulting Physician (Orthopedic Surgery)  Date of Service:  10/06/2023  CHIEF COMPLAINT: f/u of prostate cancer  CURRENT THERAPY:  Eligard every 4 months (will change to every 3 months on next visit) Abiraterone 1000 mg daily and prednisone 5 mg daily Will start Xgeva on next visit  Oncology History   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 -Unfortunately the PSMA scan showed diffuse bone metastasis, in spine, pelvic, ribs, humerus and femurs, he also has diffuse hypermetabolic adenopathy in thoracic, abdomen and pelvis.  The liver lesion was not hypermetabolic, so unlikely related to his prostate cancer. --Due to the high disease burden, I recommend triple therapy with androgen deprivation, Zytiga and prednisone, and chemotherapy docetaxel every 3 weeks for 6 cycles.  -he has received first ADT Firmagon on 12/30/22, and started Zytiga and prednisone on 01/02/2023 -He started chemo Docetaxel on 01/06/2023, and completed 6 cycles in 04/2023.  He has no urinary symptoms and hip pain has improved since he started treatment, PSA has dropped to undetectable, indicating good response to systemic treatment. -continue Eligard and zytiga/prednisone    Assessment and Plan    Metastatic Prostate Cancer Patient reports feeling good post-chemotherapy with occasional numbness and body aches.  PSA levels are undetectable, indicating a good response to Zytiga and prednisone. Considering port removal to facilitate physical activity. Discussed risks and benefits, including potential need for a new port if disease progresses. Explained that while responding well to oral medication, some patients may progress within a year and require additional treatment. - Continue Zytiga and prednisone - Schedule port removal with IR - Monitor PSA levels - Increase protein intake - Encourage regular exercise -Will change Eligard injection to every 3 months to manage his Xgeva injection  Bone Metastases Metastatic prostate cancer involving bones, increasing fracture risk. Patient needs dental clearance before starting bone-strengthening medication. Discussed importance of medication and dental clearance to avoid complications. - Obtain dental clearance within three months - Start vitamin D and calcium supplementation - Initiate bone-strengthening medication (Xgeva or infusion) post-dental clearance - Follow up every three months for bone medication administration  Diabetes Mellitus Patient on metformin and Actos for diabetes management, prescribed by primary care physician due to high blood sugar levels prior to cancer diagnosis. - Continue metformin and Actos - Follow up with primary care physician for Actos refill  General Health Maintenance Patient maintains a diet rich in fruits, vegetables, and lean meats, limiting meat intake to once a day and focusing on alkaline foods. Maintaining healthy weight and regular physical activity. Discussed importance of protein intake for muscle maintenance due to testosterone-suppressing treatment. - Continue current diet with increased protein intake - Maintain regular physical activity - Monitor weight and nutritional status   plan - Schedule port removal with IR in the next months -He received Eligard injection today, and will change to every 3 months  after next visit -Start Xgeva on next visit -Lab and follow-up in 4  months, then we will change to every 3 months after next visit -Dental clearance for Xgeva before next visit   SUMMARY OF ONCOLOGIC HISTORY: Oncology History Overview Note   Cancer Staging  Malignant neoplasm of prostate Staging form: Prostate, AJCC 8th Edition - Clinical stage from 11/05/2022: Stage IVB (cT1c, cN1, pM1c, PSA: 162, Grade Group: 5) - Signed by Marcello Fennel, PA-C on 12/24/2022 Histopathologic type: Adenocarcinoma, NOS Stage prefix: Initial diagnosis Prostate specific antigen (PSA) range: 20 or greater Gleason primary pattern: 4 Gleason secondary pattern: 5 Gleason score: 9 Histologic grading system: 5 grade system Number of biopsy cores examined: 12 Number of biopsy cores positive: 12     Malignant neoplasm of prostate (HCC)  11/05/2022 Cancer Staging   Staging form: Prostate, AJCC 8th Edition - Clinical stage from 11/05/2022: Stage IVB (cT1c, cN1, pM1c, PSA: 162, Grade Group: 5) - Signed by Marcello Fennel, PA-C on 12/24/2022 Histopathologic type: Adenocarcinoma, NOS Stage prefix: Initial diagnosis Prostate specific antigen (PSA) range: 20 or greater Gleason primary pattern: 4 Gleason secondary pattern: 5 Gleason score: 9 Histologic grading system: 5 grade system Number of biopsy cores examined: 12 Number of biopsy cores positive: 12   12/15/2022 PET scan    IMPRESSION: 1. Prostatic primary or primaries with nodal metastasis within the chest, abdomen, and pelvis. 2. The hepatic lesion on prior diagnostic CT is not tracer avid, arguing against metastatic disease. Likely an incidental benign lesion. 3. Widespread osseous metastasis.     12/24/2022 Initial Diagnosis   Malignant neoplasm of prostate   01/06/2023 -  Chemotherapy   Patient is on Treatment Plan : PROSTATE Docetaxel (75) q21d        Discussed the use of AI scribe software for clinical note transcription with the patient,  who gave verbal consent to proceed.  History of Present Illness   The patient, a 43 year old with metastatic prostate cancer, presents for a follow-up visit after chemotherapy. He reports feeling good and has resumed physical activities, including going to the gym and playing basketball. He has experienced some numbness and tingling in his arm, which he describes as feeling "asleep," but this is not constant. He also reports occasional body aches upon waking in the morning. He has noticed some hair regrowth, specifically his beard, following chemotherapy. He has been adhering to his medication regimen, which includes Zytiga and prednisone, among others. He has stopped taking two medications for nausea, Odinetron and Compazine, as he no longer needs them. He has a port, which has been causing some discomfort during physical activities, particularly push-ups. He expresses a desire to have the port removed so he can resume more strenuous workouts.         All other systems were reviewed with the patient and are negative.  MEDICAL HISTORY:  Past Medical History:  Diagnosis Date   Azoospermia non-obstructive 2021   Diabetes mellitus without complication (HCC)    Elevated PSA 11/05/2022   10/09/2022   Gross hematuria 11/12/2022   11/05/2022, 10/09/2022   Torn Achilles tendon     SURGICAL HISTORY: Past Surgical History:  Procedure Laterality Date   IR IMAGING GUIDED PORT INSERTION  12/31/2022   LAPAROSCOPIC INGUINAL HERNIA REPAIR  1991   PROSTATE BIOPSY      I have reviewed the social history and family history with the patient and they are unchanged from previous note.  ALLERGIES:  has no known allergies.  MEDICATIONS:  Current Outpatient Medications  Medication Sig Dispense Refill   abiraterone acetate (ZYTIGA) 250 MG  tablet TAKE 4 TABLETS (1,000MG ) BY  MOUTH ONCE DAILY ON AN EMPTY  STOMACH 1 HOUR BEFORE OR 2 HOURS AFTER A MEAL 120 tablet 2   Accu-Chek FastClix Lancets MISC as directed  for 90 days     Blood Glucose Monitoring Suppl (ACCU-CHEK AVIVA PLUS) w/Device KIT as directed for 90 days     Glucose Blood (BLOOD GLUCOSE TEST STRIPS 333) STRP as directed In Vitro once a day for 90 days     lidocaine-prilocaine (EMLA) cream Apply 1 Application topically as needed. To port area before port access 30 g 2   lidocaine-prilocaine (EMLA) cream Apply to affected area once 30 g 3   metFORMIN (GLUCOPHAGE) 1000 MG tablet Take 1,000 mg by mouth 2 (two) times daily with a meal.     milk thistle 175 MG tablet Take 175 mg by mouth daily.     pioglitazone (ACTOS) 30 MG tablet 1 tablet Orally Once a day for 90 days     predniSONE (DELTASONE) 5 MG tablet Take 1 tablet (5 mg total) by mouth daily with breakfast. 30 tablet 3   No current facility-administered medications for this visit.    PHYSICAL EXAMINATION: ECOG PERFORMANCE STATUS: 0 - Asymptomatic  Vitals:   10/06/23 1150  BP: (!) 134/97  Pulse: 77  Resp: 18  Temp: 98.3 F (36.8 C)  SpO2: 98%   Wt Readings from Last 3 Encounters:  10/06/23 256 lb 3.2 oz (116.2 kg)  06/03/23 253 lb 8 oz (115 kg)  04/21/23 256 lb 3.2 oz (116.2 kg)     GENERAL:alert, no distress and comfortable SKIN: skin color, texture, turgor are normal, no rashes or significant lesions EYES: normal, Conjunctiva are pink and non-injected, sclera clear NECK: supple, thyroid normal size, non-tender, without nodularity LYMPH:  no palpable lymphadenopathy in the cervical, axillary  LUNGS: clear to auscultation and percussion with normal breathing effort HEART: regular rate & rhythm and no murmurs and no lower extremity edema ABDOMEN:abdomen soft, non-tender and normal bowel sounds Musculoskeletal:no cyanosis of digits and no clubbing  NEURO: alert & oriented x 3 with fluent speech, no focal motor/sensory deficits    LABORATORY DATA:  I have reviewed the data as listed    Latest Ref Rng & Units 10/06/2023   11:07 AM 08/18/2023   10:24 AM 06/03/2023     9:41 AM  CBC  WBC 4.0 - 10.5 K/uL 4.0  4.6  4.5   Hemoglobin 13.0 - 17.0 g/dL 16.1  09.6  04.5   Hematocrit 39.0 - 52.0 % 36.2  36.8  34.8   Platelets 150 - 400 K/uL 191  185  206         Latest Ref Rng & Units 10/06/2023   11:07 AM 08/18/2023   10:24 AM 06/03/2023    9:41 AM  CMP  Glucose 70 - 99 mg/dL 409  811  914   BUN 6 - 20 mg/dL 16  17  17    Creatinine 0.61 - 1.24 mg/dL 7.82  9.56  2.13   Sodium 135 - 145 mmol/L 136  138  139   Potassium 3.5 - 5.1 mmol/L 3.9  3.8  3.9   Chloride 98 - 111 mmol/L 104  104  105   CO2 22 - 32 mmol/L 28  29  29    Calcium 8.9 - 10.3 mg/dL 9.4  9.4  9.4   Total Protein 6.5 - 8.1 g/dL 6.7  6.6  6.8   Total Bilirubin 0.0 - 1.2 mg/dL 0.7  0.7  0.5   Alkaline Phos 38 - 126 U/L 79  68  61   AST 15 - 41 U/L 13  11  10    ALT 0 - 44 U/L 12  10  8        RADIOGRAPHIC STUDIES: I have personally reviewed the radiological images as listed and agreed with the findings in the report. No results found.    Orders Placed This Encounter  Procedures   IR Removal Tun Access W/ Port W/O FL    Standing Status:   Future    Expected Date:   11/06/2023    Expiration Date:   10/05/2024    Reason for exam::   CHEMO COMPLETED    Preferred Imaging Location?:   [facility]   All questions were answered. The patient knows to call the clinic with any problems, questions or concerns. No barriers to learning was detected. The total time spent in the appointment was 30 minutes.     Malachy Mood, MD 10/06/2023

## 2023-10-07 ENCOUNTER — Telehealth: Payer: Self-pay | Admitting: Hematology

## 2023-10-07 ENCOUNTER — Encounter: Payer: Self-pay | Admitting: Nurse Practitioner

## 2023-10-07 LAB — PROSTATE-SPECIFIC AG, SERUM (LABCORP): Prostate Specific Ag, Serum: <0.1 ng/mL (ref 0.0–4.0)

## 2023-10-07 NOTE — Telephone Encounter (Signed)
Unable to leave patient a voicemail in regards to scheduled appointment, mail out of reminder for appointment has been sent on 10/07/2023

## 2023-11-15 ENCOUNTER — Ambulatory Visit (HOSPITAL_COMMUNITY)
Admission: RE | Admit: 2023-11-15 | Discharge: 2023-11-15 | Disposition: A | Discharge status: Home / Self Care | Payer: 59 | Source: Ambulatory Visit | Attending: Hematology | Admitting: Hematology

## 2023-11-15 DIAGNOSIS — C61 Malignant neoplasm of prostate: Secondary | ICD-10-CM | POA: Diagnosis present

## 2023-11-15 HISTORY — PX: IR REMOVAL TUN ACCESS W/ PORT W/O FL MOD SED: IMG2290

## 2023-11-15 MED ORDER — LIDOCAINE-EPINEPHRINE 1 %-1:100000 IJ SOLN
20.0000 mL | Freq: Once | INTRAMUSCULAR | Status: AC
  Administered 2023-11-15: 10 mL via INTRADERMAL

## 2023-11-15 MED ORDER — LIDOCAINE-EPINEPHRINE 1 %-1:100000 IJ SOLN
INTRAMUSCULAR | Status: AC
  Filled 2023-11-15: qty 1

## 2023-11-15 NOTE — Procedures (Signed)
 Vascular and Interventional Radiology Procedure Note  Patient: Timothy Rivera DOB: [birth year] Medical Record Number: [id number] Note Date/Time: 11/15/23 9:24 AM   Performing Physician: Roanna Banning, MD Assistant(s): None  Diagnosis:  Hx met prostate CA. RX completion  Procedure: PORT REMOVAL  Anesthesia: Local Anesthetic Complications: None Estimated Blood Loss: Minimal Specimens:  None  Findings:  Successful removal of a right-sided venous port. Primary incision closure. Dermabond at skin.  See detailed procedure note with images in PACS. The patient tolerated the procedure well without incident or complication and was returned to Recovery in stable condition.    Roanna Banning, MD [facility] Interventional [facility] Rad[facility]   Pager. [phone] Clinic. [phone]

## 2023-12-16 ENCOUNTER — Other Ambulatory Visit: Payer: Self-pay | Admitting: Nurse Practitioner

## 2023-12-17 ENCOUNTER — Encounter: Payer: Self-pay | Admitting: Hematology

## 2024-01-07 ENCOUNTER — Telehealth: Payer: Self-pay

## 2024-01-07 NOTE — Telephone Encounter (Signed)
 Pt called stating he takes Zytiga  and requested a refill.  Pt stated his pharmacy informed him that his insurance is requesting a PA for Zytiga .  Pt stated he contacted his insurance company who said they received the initial PA completed form from Dr. Candise Chambers office but there was some information that was missing; therefore, another PA form was sent.  Pt stated his insurance has not received the 2nd PA request form back from Dr. Candise Chambers office.  Pt stated he really needs this resolved because he is completely out of Zytiga  and has been since 12/31/2023.  Notified FMLA/PA/Disability Team of the pt's call.

## 2024-01-11 ENCOUNTER — Other Ambulatory Visit: Payer: Self-pay

## 2024-01-12 ENCOUNTER — Encounter: Payer: Self-pay | Admitting: Hematology

## 2024-01-12 ENCOUNTER — Telehealth: Payer: Self-pay

## 2024-01-12 ENCOUNTER — Other Ambulatory Visit (HOSPITAL_COMMUNITY): Payer: Self-pay

## 2024-01-12 NOTE — Telephone Encounter (Signed)
 Oral Chemotherapy Pharmacist Encounter   Spoke with patient today to follow up regarding patient's oral chemotherapy medication: Zytiga  (abiraterone  acetate)  Notified patient that PA is approved for his Zytiga  and that he can continue filling it through [employer]. Patient expressed appreciation and knows to call the offices with any other questions or concerns.   Jude Norton, PharmD, BCPS, BCOP Hematology/Oncology Clinical Pharmacist [facility] and [facility] Oral Chemotherapy Navigation Clinics [phone] 01/12/2024 3:47 PM

## 2024-01-12 NOTE — Telephone Encounter (Signed)
 Spoke with pt via telephone to make pt aware that Dr. Candise Chambers office is still working with our PA & PO Chemo Team in getting the PA resolved for the pt's Zytiga .  Stated that once it's resolved Jude Norton, PharmD will contact pt regarding the outcome of conversation with pt's insurance regarding the PA.  Pt verbalized understanding and had no further question.

## 2024-01-12 NOTE — Telephone Encounter (Signed)
 Oral Oncology Patient Advocate Encounter  Re-authorization   Received notification that prior authorization for Abiraterone  is required.   PA submitted on 01/12/24  Key [id number]  Status is pending     Hansel Ley, CPhT Pharmacy Technician Coordinator [facility] [phone] (Ph) 01/12/2024 11:28 AM

## 2024-01-12 NOTE — Telephone Encounter (Signed)
 Oral Oncology Patient Advocate Encounter  Prior Authorization for Abiraterone  has been approved.    PA# [id number]  Effective dates: 01/12/24 through 01/11/25  Patient may continue to fill at [facility] as they have been.    Hansel Ley, CPhT Pharmacy Technician Coordinator [facility] [phone] (Ph) 01/12/2024 2:03 PM

## 2024-01-30 ENCOUNTER — Other Ambulatory Visit: Payer: Self-pay | Admitting: Nurse Practitioner

## 2024-01-30 DIAGNOSIS — C61 Malignant neoplasm of prostate: Secondary | ICD-10-CM

## 2024-01-30 NOTE — Progress Notes (Signed)
 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 -Unfortunately the PSMA scan showed diffuse bone metastasis, in spine, pelvic, ribs, humerus and femurs, he also has diffuse hypermetabolic adenopathy in thoracic, abdomen and pelvis.  The liver lesion was not hypermetabolic, so unlikely related to his prostate cancer. --Due to the high disease burden, I recommend triple therapy with androgen deprivation, Zytiga  and prednisone , and chemotherapy docetaxel  every 3 weeks for 6 cycles.  -he has received first ADT Firmagon  on 12/30/22, and started Zytiga  and prednisone  on 01/02/2023 -He started chemo Docetaxel  on 01/06/2023, and completed 6 cycles in 04/2023.  He has no urinary symptoms and hip pain has improved since he started treatment, PSA has dropped to undetectable, indicating good response to systemic treatment. -continue Eligard  and zytiga /prednisone   -needs dental clearance to begin xgeva .    Plan: -Reviewed labs. --CBC with mild anemia, and CMP unremarkable.  PSA <0.01. - patient had port removal in March 2025.  -He received Eligard  injection today, and will change to every 3 months.  Visit -Start Xgeva  on next visit - still waiting for patient to obtain dental clearance prior to starting Xgeva .  -Dental clearance for Xgeva  before next  visit - Continue Zytiga  1000 mg daily. - Proceed with Eligard  injection today and continue every 3 months.  The patient understands the plans discussed today and is in agreement with them.  He knows to contact our office if he develops concerns prior to his next appointment.  I provided 25 minutes of face-to-face time during this encounter and > 50% was spent counseling as documented under my assessment and plan.    Sharyon Deis, NP  [facility] - A DEPT OF [facility] [address] [place] [address] Dept: [phone] Dept Fax: [phone]   No orders of the defined types were placed in this encounter.     CHIEF COMPLAINT:  CC: malignant neoplasm of prostate    Current Treatment:  Eligard  every 3 months,  abiraterone  1000 mg daily and prednisone  5 mg daily.  Xgeva  following dental clearance.  INTERVAL HISTORY:  Timothy Rivera is here today for repeat clinical assessment. He was last seen by Dr. Maryalice Smaller 10/06/2023.  Patient doing well.  States he did have trouble getting Zytiga .  Wants approximately 3 weeks without it.  Still needs to get dental clearance to have Xgeva .  Plans to get this the week after next. He denies chest pain, chest pressure, or shortness of breath. He denies headaches or visual disturbances. He denies abdominal pain, nausea, vomiting, or changes in bowel or bladder habits.   He denies fevers or chills. He denies pain. His appetite is good. His weight has increased 8 pounds over last 4 months.  I have reviewed the past medical history, past surgical history, social history and family history with the patient and they are  unchanged from previous note.  ALLERGIES:  has no known allergies.  MEDICATIONS:  Current Outpatient Medications  Medication Sig Dispense Refill   abiraterone  acetate (ZYTIGA ) 250 MG tablet TAKE 4 TABLETS (1,000MG ) BY  MOUTH ONCE DAILY ON AN EMPTY  STOMACH 1 HOUR BEFORE OR 2  HOURS AFTER A MEAL 120 tablet 2   Accu-Chek FastClix Lancets MISC as directed for 90 days     Blood Glucose Monitoring Suppl (ACCU-CHEK AVIVA PLUS) w/Device KIT as directed for 90 days     Glucose Blood (BLOOD GLUCOSE TEST STRIPS 333) STRP as directed In Vitro once a day for 90 days     metFORMIN (GLUCOPHAGE) 1000 MG tablet Take 1,000 mg by mouth 2 (two) times daily with a meal.     milk thistle 175 MG tablet Take 175 mg by mouth daily.     pioglitazone (ACTOS) 30 MG tablet 1 tablet Orally Once a day for 90 days     predniSONE  (DELTASONE ) 5 MG tablet Take 1 tablet (5 mg total) by mouth daily with breakfast. 30 tablet 3   No current facility-administered medications for this visit.    HISTORY OF PRESENT ILLNESS:   Oncology History Overview Note   Cancer Staging  Malignant neoplasm of prostate Staging form: Prostate, AJCC 8th Edition - Clinical stage from 11/05/2022: Stage IVB (cT1c, cN1, pM1c, PSA: 162, Grade Group: 5) - Signed by Keitha Pata, PA-C on 12/24/2022 Histopathologic type: Adenocarcinoma, NOS Stage prefix: Initial diagnosis Prostate specific antigen (PSA) range: 20 or greater Gleason primary pattern: 4 Gleason secondary pattern: 5 Gleason score: 9 Histologic grading system: 5 grade system Number of biopsy cores examined: 12 Number of biopsy cores positive: 12     Malignant neoplasm of prostate (HCC)  11/05/2022 Cancer Staging   Staging form: Prostate, AJCC 8th Edition - Clinical stage from 11/05/2022: Stage IVB (cT1c, cN1, pM1c, PSA: 162, Grade Group: 5) - Signed by Keitha Pata, PA-C on 12/24/2022 Histopathologic type: Adenocarcinoma, NOS Stage prefix: Initial diagnosis Prostate specific antigen (PSA) range: 20 or greater Gleason primary pattern: 4 Gleason secondary pattern: 5 Gleason score: 9 Histologic grading system: 5 grade system Number of biopsy cores examined: 12 Number of biopsy cores positive: 12   12/15/2022 PET scan    IMPRESSION: 1. Prostatic  primary or primaries with nodal metastasis within the chest, abdomen, and pelvis. 2. The hepatic lesion on prior diagnostic CT is not tracer avid, arguing against metastatic disease. Likely an incidental benign lesion. 3. Widespread osseous metastasis.     12/24/2022 Initial Diagnosis   Malignant neoplasm of prostate   01/06/2023 - 04/21/2023 Chemotherapy   Patient is on Treatment Plan : PROSTATE Docetaxel  (75) q21d         REVIEW OF SYSTEMS:   Constitutional: Denies fevers, chills or abnormal weight loss Eyes: Denies blurriness of vision Ears, nose, mouth, throat, and face: Denies mucositis or sore throat Respiratory: Denies cough, dyspnea or wheezes Cardiovascular: Denies palpitation, chest discomfort or lower extremity swelling Gastrointestinal:  Denies nausea, heartburn or change in bowel habits Skin: Denies abnormal skin rashes Lymphatics: Denies new lymphadenopathy or easy bruising Neurological:Denies numbness, tingling or new weaknesses Behavioral/Psych: Mood is stable, no new changes  All other systems were reviewed with the patient and are negative.   VITALS:   Today's Vitals   01/31/24 1126 01/31/24 1200  BP: 127/82   Pulse: 71   Resp: 14   Temp: 97.9 F (36.6 C)   TempSrc: Temporal   SpO2: 99%   Weight:  264 lb 3.2 oz (119.8 kg)   PainSc:  0-No pain   Body mass index is 33.92 kg/m.   Wt Readings from Last 3 Encounters:  01/31/24 264 lb 3.2 oz (119.8 kg)  10/06/23 256 lb 3.2 oz (116.2 kg)  06/03/23 253 lb 8 oz (115 kg)    Body mass index is 33.92 kg/m.  Performance status (ECOG): 1 - Symptomatic but completely ambulatory  PHYSICAL EXAM:   GENERAL:alert, no distress and comfortable SKIN: skin color, texture, turgor are normal, no rashes or significant lesions EYES: normal, Conjunctiva are pink and non-injected, sclera clear OROPHARYNX:no exudate, no erythema and lips, buccal mucosa, and tongue normal  NECK: supple, thyroid normal size, non-tender,  without nodularity LYMPH:  no palpable lymphadenopathy in the cervical, axillary or inguinal LUNGS: clear to auscultation and percussion with normal breathing effort HEART: regular rate & rhythm and no murmurs and no lower extremity edema ABDOMEN:abdomen soft, non-tender and normal bowel sounds Musculoskeletal:no cyanosis of digits and no clubbing  NEURO: alert & oriented x 3 with fluent speech, no focal motor/sensory deficits  LABORATORY DATA:  I have reviewed the data as listed    Component Value Date/Time   NA 140 01/31/2024 1033   K 4.2 01/31/2024 1033   CL 107 01/31/2024 1033   CO2 28 01/31/2024 1033   GLUCOSE 113 (H) 01/31/2024 1033   BUN 15 01/31/2024 1033   CREATININE 0.93 01/31/2024 1033   CALCIUM 9.2 01/31/2024 1033   PROT 7.0 01/31/2024 1033   ALBUMIN 4.4 01/31/2024 1033   AST 14 (L) 01/31/2024 1033   ALT 15 01/31/2024 1033   ALKPHOS 80 01/31/2024 1033   BILITOT 0.6 01/31/2024 1033   GFRNONAA >60 01/31/2024 1033    Lab Results  Component Value Date   WBC 4.6 01/31/2024   NEUTROABS 2.1 01/31/2024   HGB 12.6 (L) 01/31/2024   HCT 37.5 (L) 01/31/2024   MCV 78.8 (L) 01/31/2024   PLT 196 01/31/2024

## 2024-01-30 NOTE — Assessment & Plan Note (Addendum)
 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 -Unfortunately the PSMA scan showed diffuse bone metastasis, in spine, pelvic, ribs, humerus and femurs, he also has diffuse hypermetabolic adenopathy in thoracic, abdomen and pelvis.  The liver lesion was not hypermetabolic, so unlikely related to his prostate cancer. --Due to the high disease burden, I recommend triple therapy with androgen deprivation, Zytiga  and prednisone , and chemotherapy docetaxel  every 3 weeks for 6 cycles.  -he has received first ADT Firmagon  on 12/30/22, and started Zytiga  and prednisone  on 01/02/2023 -He started chemo Docetaxel  on 01/06/2023, and completed 6 cycles in 04/2023.  He has no urinary symptoms and hip pain has improved since he started treatment, PSA has dropped to undetectable, indicating good response to systemic treatment. -continue Eligard  and zytiga /prednisone   -needs dental clearance to begin xgeva.

## 2024-01-31 ENCOUNTER — Inpatient Hospital Stay

## 2024-01-31 ENCOUNTER — Inpatient Hospital Stay (HOSPITAL_BASED_OUTPATIENT_CLINIC_OR_DEPARTMENT_OTHER): Payer: 59 | Admitting: Nurse Practitioner

## 2024-01-31 ENCOUNTER — Inpatient Hospital Stay: Payer: 59

## 2024-01-31 ENCOUNTER — Inpatient Hospital Stay: Payer: 59 | Attending: Nurse Practitioner

## 2024-01-31 VITALS — BP 127/82 | HR 71 | Temp 97.9°F | Resp 14 | Wt 264.2 lb

## 2024-01-31 DIAGNOSIS — Z95828 Presence of other vascular implants and grafts: Secondary | ICD-10-CM

## 2024-01-31 DIAGNOSIS — C61 Malignant neoplasm of prostate: Secondary | ICD-10-CM

## 2024-01-31 DIAGNOSIS — C7951 Secondary malignant neoplasm of bone: Secondary | ICD-10-CM | POA: Insufficient documentation

## 2024-01-31 DIAGNOSIS — Z5111 Encounter for antineoplastic chemotherapy: Secondary | ICD-10-CM | POA: Diagnosis present

## 2024-01-31 LAB — CBC WITH DIFFERENTIAL (CANCER CENTER ONLY)
Abs Immature Granulocytes: 0.01 10*3/uL (ref 0.00–0.07)
Basophils Absolute: 0.1 10*3/uL (ref 0.0–0.1)
Basophils Relative: 1 %
Eosinophils Absolute: 0.2 10*3/uL (ref 0.0–0.5)
Eosinophils Relative: 5 %
HCT: 37.5 % — ABNORMAL LOW (ref 39.0–52.0)
Hemoglobin: 12.6 g/dL — ABNORMAL LOW (ref 13.0–17.0)
Immature Granulocytes: 0 %
Lymphocytes Relative: 38 %
Lymphs Abs: 1.8 10*3/uL (ref 0.7–4.0)
MCH: 26.5 pg (ref 26.0–34.0)
MCHC: 33.6 g/dL (ref 30.0–36.0)
MCV: 78.8 fL — ABNORMAL LOW (ref 80.0–100.0)
Monocytes Absolute: 0.4 10*3/uL (ref 0.1–1.0)
Monocytes Relative: 9 %
Neutro Abs: 2.1 10*3/uL (ref 1.7–7.7)
Neutrophils Relative %: 47 %
Platelet Count: 196 10*3/uL (ref 150–400)
RBC: 4.76 MIL/uL (ref 4.22–5.81)
RDW: 13.5 % (ref 11.5–15.5)
WBC Count: 4.6 10*3/uL (ref 4.0–10.5)
nRBC: 0 % (ref 0.0–0.2)

## 2024-01-31 LAB — CMP (CANCER CENTER ONLY)
ALT: 15 U/L (ref 0–44)
AST: 14 U/L — ABNORMAL LOW (ref 15–41)
Albumin: 4.4 g/dL (ref 3.5–5.0)
Alkaline Phosphatase: 80 U/L (ref 38–126)
Anion gap: 5 (ref 5–15)
BUN: 15 mg/dL (ref 6–20)
CO2: 28 mmol/L (ref 22–32)
Calcium: 9.2 mg/dL (ref 8.9–10.3)
Chloride: 107 mmol/L (ref 98–111)
Creatinine: 0.93 mg/dL (ref 0.61–1.24)
GFR, Estimated: >60 mL/min (ref >60)
Glucose, Bld: 113 mg/dL — ABNORMAL HIGH (ref 70–99)
Potassium: 4.2 mmol/L (ref 3.5–5.1)
Sodium: 140 mmol/L (ref 135–145)
Total Bilirubin: 0.6 mg/dL (ref 0.0–1.2)
Total Protein: 7 g/dL (ref 6.5–8.1)

## 2024-01-31 MED ORDER — HEPARIN SOD (PORK) LOCK FLUSH 100 UNIT/ML IV SOLN: 500.0000 [IU] | Freq: Once | INTRAVENOUS | Status: DC

## 2024-01-31 MED ORDER — DENOSUMAB 120 MG/1.7ML ~~LOC~~ SOLN
120.0000 mg | Freq: Once | SUBCUTANEOUS | Status: DC
Start: 2024-01-31 — End: 2024-01-31

## 2024-01-31 MED ORDER — SODIUM CHLORIDE 0.9% FLUSH: 10.0000 mL | INTRAVENOUS | Status: DC | PRN

## 2024-01-31 MED ORDER — LEUPROLIDE ACETATE (3 MONTH) 22.5 MG ~~LOC~~ KIT
22.5000 mg | PACK | Freq: Once | SUBCUTANEOUS | Status: AC
  Administered 2024-01-31: 22.5 mg via SUBCUTANEOUS
  Filled 2024-01-31: qty 22.5

## 2024-02-01 ENCOUNTER — Ambulatory Visit: Payer: Self-pay | Admitting: Nurse Practitioner

## 2024-02-01 LAB — PROSTATE-SPECIFIC AG, SERUM (LABCORP): Prostate Specific Ag, Serum: <0.1 ng/mL (ref 0.0–4.0)

## 2024-02-07 ENCOUNTER — Encounter: Payer: Self-pay | Admitting: Hematology

## 2024-02-07 ENCOUNTER — Encounter: Payer: Self-pay | Admitting: Nurse Practitioner

## 2024-05-05 ENCOUNTER — Other Ambulatory Visit: Payer: Self-pay | Admitting: Nurse Practitioner

## 2024-06-22 ENCOUNTER — Encounter: Payer: Self-pay | Admitting: Hematology

## 2024-08-28 ENCOUNTER — Other Ambulatory Visit: Payer: Self-pay | Admitting: Hematology

## 2024-08-28 DIAGNOSIS — C61 Malignant neoplasm of prostate: Secondary | ICD-10-CM

## 2024-08-29 ENCOUNTER — Telehealth: Payer: Self-pay

## 2024-08-29 ENCOUNTER — Telehealth: Payer: Self-pay | Admitting: Hematology

## 2024-08-29 ENCOUNTER — Other Ambulatory Visit: Payer: Self-pay

## 2024-08-29 NOTE — Telephone Encounter (Signed)
 Received vmail requesting a callback to schedule lab,office visit, and injection appointments. Contacted patient via telephone call at T#[phone]. Spoke with patient, transferred patient's call to scheduler to further assist.

## 2024-09-03 NOTE — Assessment & Plan Note (Signed)
 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 -Unfortunately the PSMA scan showed diffuse bone metastasis, in spine, pelvic, ribs, humerus and femurs, he also has diffuse hypermetabolic adenopathy in thoracic, abdomen and pelvis.  The liver lesion was not hypermetabolic, so unlikely related to his prostate cancer. --Due to the high disease burden, I recommend triple therapy with androgen deprivation, Zytiga and prednisone, and chemotherapy docetaxel every 3 weeks for 6 cycles.  -he has received first ADT Firmagon on 12/30/22, and started Zytiga and prednisone on 01/02/2023 -He started chemo Docetaxel on 01/06/2023, and completed 6 cycles in 04/2023.  He has no urinary symptoms and hip pain has improved since he started treatment, PSA has dropped to undetectable, indicating good response to systemic treatment. -continue Eligard and zytiga/prednisone

## 2024-09-04 ENCOUNTER — Telehealth: Payer: Self-pay | Admitting: Pharmacist

## 2024-09-04 ENCOUNTER — Inpatient Hospital Stay: Attending: Hematology

## 2024-09-04 ENCOUNTER — Other Ambulatory Visit: Payer: Self-pay | Admitting: *Deleted

## 2024-09-04 ENCOUNTER — Inpatient Hospital Stay: Admitting: Hematology

## 2024-09-04 ENCOUNTER — Other Ambulatory Visit (HOSPITAL_COMMUNITY): Payer: Self-pay

## 2024-09-04 ENCOUNTER — Inpatient Hospital Stay

## 2024-09-04 ENCOUNTER — Encounter: Payer: Self-pay | Admitting: Hematology

## 2024-09-04 ENCOUNTER — Other Ambulatory Visit: Payer: Self-pay

## 2024-09-04 VITALS — BP 139/100 | HR 77 | Temp 97.6°F | Resp 16 | Wt 274.8 lb

## 2024-09-04 DIAGNOSIS — Z5111 Encounter for antineoplastic chemotherapy: Secondary | ICD-10-CM | POA: Insufficient documentation

## 2024-09-04 DIAGNOSIS — C7951 Secondary malignant neoplasm of bone: Secondary | ICD-10-CM | POA: Diagnosis not present

## 2024-09-04 DIAGNOSIS — C61 Malignant neoplasm of prostate: Secondary | ICD-10-CM | POA: Insufficient documentation

## 2024-09-04 DIAGNOSIS — Z95828 Presence of other vascular implants and grafts: Secondary | ICD-10-CM

## 2024-09-04 LAB — CMP (CANCER CENTER ONLY)
ALT: 17 U/L (ref 0–44)
AST: 18 U/L (ref 15–41)
Albumin: 4.5 g/dL (ref 3.5–5.0)
Alkaline Phosphatase: 114 U/L (ref 38–126)
Anion gap: 9 (ref 5–15)
BUN: 15 mg/dL (ref 6–20)
CO2: 28 mmol/L (ref 22–32)
Calcium: 9.1 mg/dL (ref 8.9–10.3)
Chloride: 102 mmol/L (ref 98–111)
Creatinine: 1 mg/dL (ref 0.61–1.24)
GFR, Estimated: >60 mL/min
Glucose, Bld: 149 mg/dL — ABNORMAL HIGH (ref 70–99)
Potassium: 4.6 mmol/L (ref 3.5–5.1)
Sodium: 138 mmol/L (ref 135–145)
Total Bilirubin: 0.6 mg/dL (ref 0.0–1.2)
Total Protein: 7.2 g/dL (ref 6.5–8.1)

## 2024-09-04 LAB — CBC WITH DIFFERENTIAL (CANCER CENTER ONLY)
Abs Immature Granulocytes: 0.01 K/uL (ref 0.00–0.07)
Basophils Absolute: 0.1 K/uL (ref 0.0–0.1)
Basophils Relative: 2 %
Eosinophils Absolute: 0.3 K/uL (ref 0.0–0.5)
Eosinophils Relative: 5 %
HCT: 37.5 % — ABNORMAL LOW (ref 39.0–52.0)
Hemoglobin: 12.5 g/dL — ABNORMAL LOW (ref 13.0–17.0)
Immature Granulocytes: 0 %
Lymphocytes Relative: 37 %
Lymphs Abs: 2 K/uL (ref 0.7–4.0)
MCH: 26 pg (ref 26.0–34.0)
MCHC: 33.3 g/dL (ref 30.0–36.0)
MCV: 78.1 fL — ABNORMAL LOW (ref 80.0–100.0)
Monocytes Absolute: 0.6 K/uL (ref 0.1–1.0)
Monocytes Relative: 11 %
Neutro Abs: 2.5 K/uL (ref 1.7–7.7)
Neutrophils Relative %: 45 %
Platelet Count: 193 K/uL (ref 150–400)
RBC: 4.8 MIL/uL (ref 4.22–5.81)
RDW: 13.2 % (ref 11.5–15.5)
WBC Count: 5.3 K/uL (ref 4.0–10.5)
nRBC: 0 % (ref 0.0–0.2)

## 2024-09-04 LAB — PSA: Prostatic Specific Antigen: 0.08 ng/mL (ref 0.00–4.00)

## 2024-09-04 MED ORDER — PREDNISONE 5 MG PO TABS: 5.0000 mg | ORAL_TABLET | Freq: Every day | ORAL | 3 refills | Status: AC

## 2024-09-04 MED ORDER — LEUPROLIDE ACETATE (3 MONTH) 22.5 MG ~~LOC~~ KIT
22.5000 mg | PACK | Freq: Once | SUBCUTANEOUS | Status: AC
  Administered 2024-09-04: 22.5 mg via SUBCUTANEOUS
  Filled 2024-09-04: qty 22.5

## 2024-09-04 MED ORDER — ABIRATERONE ACETATE 250 MG PO TABS: 250.0000 mg | ORAL_TABLET | Freq: Every day | ORAL | 5 refills | Status: DC

## 2024-09-04 MED ORDER — DENOSUMAB 120 MG/1.7ML ~~LOC~~ SOLN: 120.0000 mg | Freq: Once | SUBCUTANEOUS | Status: DC

## 2024-09-04 NOTE — Progress Notes (Signed)
 Haven't obtained dental clearance. Xgeva  order discontinued.  Harlene Nasuti, PharmD Oncology Infusion Pharmacist 09/04/2024 10:50 AM

## 2024-09-04 NOTE — Telephone Encounter (Signed)
 Oral Oncology Pharmacist Encounter  Notified by Dr. Lanny that patient has $100 copay with current Zytiga  prescription and patient is interested in trying abiraterone  250 mg daily with low fat meal to decrease financial burden.   Called [facility] and confirmed that patient would have a decreased medication cost with dose change. Per pharmacy #30 of abiraterone  250 mg tablets would be $25.23 for a 30 day supply.   Called and spoke with Timothy Rivera. Informed him of the cost change above and he is wanting to change to taking abiraterone  250 mg daily with low fat meal. We discussed low-fat breakfast options include, but are not limited to, fat-free or low fat (1%) milk, yogurt, cottage cheese, oatmeal, whole grain bagels, english muffins, egg whites. Patient advised to avoid fried, fatty or greasy food (bacon or sausage), pastries, etc. Goal is for patient's breakfast to have no more than 3 grams of fat per 100-calorie serving.   Handout emailed to patient with the specific food recommendations.   Asberry Macintosh, PharmD, BCPS, BCOP Hematology/Oncology Clinical Pharmacist [phone] 09/04/2024 2:18 PM

## 2024-09-04 NOTE — Progress Notes (Signed)
 " [facility]   Telephone:([phone] Fax:([phone]) [phone]   Clinic Follow up Note   Patient Care Team: Delayne Artist PARAS, MD as PCP - General (Family Medicine) Lanny Callander, MD as Consulting Physician (Hematology and Oncology) Lovie Arlyss CROME, MD as Referring Physician (Urology) Marsa Duncans, MD (Inactive) as Consulting Physician (Sports Medicine) Cristy Bonner DASEN, MD as Consulting Physician (Orthopedic Surgery)  Date of Service:  09/04/2024  CHIEF COMPLAINT: f/u of metastatic prostate cancer  CURRENT THERAPY:  Eligard  every 3 months  Abiraterone  1000 mg daily and prednisone  5 mg daily  Oncology History   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 -Unfortunately the PSMA scan showed diffuse bone metastasis, in spine, pelvic, ribs, humerus and femurs, he also has diffuse hypermetabolic adenopathy in thoracic, abdomen and pelvis.  The liver lesion was not hypermetabolic, so unlikely related to his prostate cancer. --Due to the high disease burden, I recommend triple therapy with androgen deprivation, Zytiga  and prednisone , and chemotherapy docetaxel  every 3 weeks for 6 cycles.  -he has received first ADT Firmagon  on 12/30/22, and started Zytiga  and prednisone  on 01/02/2023 -He started chemo Docetaxel  on 01/06/2023, and completed 6 cycles in 04/2023.  He has no urinary symptoms and hip pain has improved since he started treatment, PSA has dropped to undetectable, indicating good response to systemic treatment. -continue Eligard  and zytiga /prednisone   Assessment & Plan Metastatic prostate cancer He is managed with androgen deprivation therapy and oral abiraterone  with prednisone . A seven-month gap in androgen deprivation therapy occurred due to a  scheduling error, but he continued abiraterone . He reports mild hot flashes and night sweats, consistent with anti-androgen therapy, which are not bothersome. He has no residual neuropathy from prior chemotherapy and remains physically active. Laboratory evaluation reveals very mild anemia with otherwise normal hematologic parameters. PSA, previously undetectable, may be slightly increased due to the missed injection, but this is not expected to necessitate chemotherapy. The missed injection may have permitted some disease progression, but resumption of therapy is expected to restore disease control. He experienced increased copay for abiraterone  and intermittent prednisone  gaps due to insurance issues, both now addressed. Dental clearance is required for ongoing therapy and has not yet been obtained. - Changed injection interval to every three months and scheduled future injections accordingly. - Refilled abiraterone  prescription and discussed dose reduction to 250mg  daily if taken with a low-fat meal to improve absorption and potentially reduce copay costs. - Arranged for pharmacist to contact him regarding dietary modifications and financial assistance for abiraterone . - Refilled prednisone  prescription at his preferred local pharmacy. - Deleted outdated pharmacy information per his request. - Provided letter for dental clearance prior to next injection and instructed him to schedule dental cleaning. - Ordered routine laboratory monitoring including PSA, CBC, renal, and hepatic function tests. - Scheduled follow-up visit in three months.  Plan - He is clinically doing well, lab reviewed, PSA still pending. - Restart Eligard  injection today and continue every 3 months - Continue Zytiga  and prednisone , I refilled for him.  Patient is interested in reducing Zytiga  dose to 250 mg daily with low fat meal, education provided by our oral pharmacist [place] today. - Will call his dentist for dental  clearance, and to start Zometa or Xgeva  after clearance. I provided him the letter  -  Follow-up in 3 months with lab and injection   SUMMARY OF ONCOLOGIC HISTORY: Oncology History Overview Note   Cancer Staging  Malignant neoplasm of prostate Staging form: Prostate, AJCC 8th Edition - Clinical stage from 11/05/2022: Stage IVB (cT1c, cN1, pM1c, PSA: 162, Grade Group: 5) - Signed by Sherwood Rise, PA-C on 12/24/2022 Histopathologic type: Adenocarcinoma, NOS Stage prefix: Initial diagnosis Prostate specific antigen (PSA) range: 20 or greater Gleason primary pattern: 4 Gleason secondary pattern: 5 Gleason score: 9 Histologic grading system: 5 grade system Number of biopsy cores examined: 12 Number of biopsy cores positive: 12     Malignant neoplasm of prostate (HCC)  11/05/2022 Cancer Staging   Staging form: Prostate, AJCC 8th Edition - Clinical stage from 11/05/2022: Stage IVB (cT1c, cN1, pM1c, PSA: 162, Grade Group: 5) - Signed by Sherwood Rise, PA-C on 12/24/2022 Histopathologic type: Adenocarcinoma, NOS Stage prefix: Initial diagnosis Prostate specific antigen (PSA) range: 20 or greater Gleason primary pattern: 4 Gleason secondary pattern: 5 Gleason score: 9 Histologic grading system: 5 grade system Number of biopsy cores examined: 12 Number of biopsy cores positive: 12   12/15/2022 PET scan    IMPRESSION: 1. Prostatic primary or primaries with nodal metastasis within the chest, abdomen, and pelvis. 2. The hepatic lesion on prior diagnostic CT is not tracer avid, arguing against metastatic disease. Likely an incidental benign lesion. 3. Widespread osseous metastasis.     12/24/2022 Initial Diagnosis   Malignant neoplasm of prostate   01/06/2023 - 04/21/2023 Chemotherapy   Patient is on Treatment Plan : PROSTATE Docetaxel  (75) q21d        Discussed the use of AI scribe software for clinical note transcription with the patient, who gave verbal consent to  proceed.  History of Present Illness Timothy Rivera is a 43 year old male with metastatic prostate cancer on androgen deprivation therapy and abiraterone /prednisone  who presents for follow-up and delayed hormone therapy injection.  He was due for his hormone therapy injection in August, but it was missed due to a scheduling error, so there has been a seven-month gap since his last dose in May. He identified the missed appointment and rescheduled.  He continues abiraterone , taking four tablets in the morning on an empty stomach, though he sometimes skips breakfast. His abiraterone  copay recently increased to about $100, and he has had difficulty securing financial assistance. He obtains a three-month supply [place] pharmacy. He had a several-week interruption in prednisone  due to insurance approval but has now resumed it after changing to a local [facility] following a recent move.  He has not yet obtained dental clearance for ongoing therapy and has not had a recent dental cleaning, but he plans to schedule this.  He has gained about 15 pounds and has restarted gym activity. His energy level is acceptable to him.  He has no pain, neuropathy, or port issues, and his port has been removed. He has mild, improved hot flashes and night sweats that are not bothersome and no other new or concerning symptoms. He notes he had to initiate contact with the clinic to arrange this visit.     All other systems were reviewed with the patient and are negative.  MEDICAL HISTORY:  Past Medical History:  Diagnosis Date   Azoospermia non-obstructive 2021   Diabetes mellitus without complication (HCC)    Elevated PSA 11/05/2022   10/09/2022   Gross hematuria 11/12/2022   11/05/2022, 10/09/2022   Torn Achilles tendon     SURGICAL HISTORY: Past Surgical History:  Procedure Laterality Date   IR IMAGING GUIDED PORT INSERTION  12/31/2022   IR REMOVAL TUN ACCESS W/ PORT W/O FL MOD SED  11/15/2023    LAPAROSCOPIC INGUINAL HERNIA REPAIR  1991   PROSTATE BIOPSY      I have reviewed the social history and family history with the patient and they are unchanged from previous note.  ALLERGIES:  has no known allergies.  MEDICATIONS:  Current Outpatient Medications  Medication Sig Dispense Refill   abiraterone  acetate (ZYTIGA ) 250 MG tablet TAKE 4 TABLETS (1,000MG ) BY  MOUTH ONCE DAILY ON AN EMPTY  STOMACH 1 HOUR BEFORE OR 2 HOURS AFTER A MEAL 120 tablet 2   Accu-Chek FastClix Lancets MISC as directed for 90 days     Blood Glucose Monitoring Suppl (ACCU-CHEK AVIVA PLUS) w/Device KIT as directed for 90 days     Glucose Blood (BLOOD GLUCOSE TEST STRIPS 333) STRP as directed In Vitro once a day for 90 days     metFORMIN (GLUCOPHAGE) 1000 MG tablet Take 1,000 mg by mouth 2 (two) times daily with a meal.     milk thistle 175 MG tablet Take 175 mg by mouth daily.     pioglitazone (ACTOS) 30 MG tablet 1 tablet Orally Once a day for 90 days     predniSONE  (DELTASONE ) 5 MG tablet Take 1 tablet (5 mg total) by mouth daily with breakfast. 90 tablet 3   No current facility-administered medications for this visit.    PHYSICAL EXAMINATION: ECOG PERFORMANCE STATUS: 0 - Asymptomatic  Vitals:   09/04/24 0936 09/04/24 0939  BP: (!) 141/100 (!) 139/100  Pulse: 73 77  Resp: 16   Temp: 97.6 F (36.4 C)   SpO2: 100% 100%   Wt Readings from Last 3 Encounters:  09/04/24 274 lb 12.8 oz (124.6 kg)  01/31/24 264 lb 3.2 oz (119.8 kg)  10/06/23 256 lb 3.2 oz (116.2 kg)     GENERAL:alert, no distress and comfortable SKIN: skin color, texture, turgor are normal, no rashes or significant lesions EYES: normal, Conjunctiva are pink and non-injected, sclera clear NECK: supple, thyroid normal size, non-tender, without nodularity LYMPH:  no palpable lymphadenopathy in the cervical, axillary  LUNGS: clear to auscultation and percussion with normal breathing effort HEART: regular rate & rhythm and no murmurs and  no lower extremity edema ABDOMEN:abdomen soft, non-tender and normal bowel sounds Musculoskeletal:no cyanosis of digits and no clubbing  NEURO: alert & oriented x 3 with fluent speech, no focal motor/sensory deficits  Physical Exam    LABORATORY DATA:  I have reviewed the data as listed    Latest Ref Rng & Units 09/04/2024    9:18 AM 01/31/2024   10:33 AM 10/06/2023   11:07 AM  CBC  WBC 4.0 - 10.5 K/uL 5.3  4.6  4.0   Hemoglobin 13.0 - 17.0 g/dL 87.4  87.3  87.3   Hematocrit 39.0 - 52.0 % 37.5  37.5  36.2   Platelets 150 - 400 K/uL 193  196  191         Latest Ref Rng & Units 09/04/2024    9:18 AM 01/31/2024   10:33 AM 10/06/2023   11:07 AM  CMP  Glucose 70 - 99 mg/dL 850  886  888   BUN 6 - 20 mg/dL 15  15  16    Creatinine 0.61 - 1.24 mg/dL 8.99  9.06  9.05   Sodium 135 - 145 mmol/L 138  140  136   Potassium 3.5 -  5.1 mmol/L 4.6  4.2  3.9   Chloride 98 - 111 mmol/L 102  107  104   CO2 22 - 32 mmol/L 28  28  28    Calcium 8.9 - 10.3 mg/dL 9.1  9.2  9.4   Total Protein 6.5 - 8.1 g/dL 7.2  7.0  6.7   Total Bilirubin 0.0 - 1.2 mg/dL 0.6  0.6  0.7   Alkaline Phos 38 - 126 U/L 114  80  79   AST 15 - 41 U/L 18  14  13    ALT 0 - 44 U/L 17  15  12        RADIOGRAPHIC STUDIES: I have personally reviewed the radiological images as listed and agreed with the findings in the report. No results found.    No orders of the defined types were placed in this encounter.  All questions were answered. The patient knows to call the clinic with any problems, questions or concerns. No barriers to learning was detected. The total time spent in the appointment was 40 minutes, including review of chart and various tests results, discussions about plan of care and coordination of care plan     Onita Mattock, MD 09/04/2024     "

## 2024-09-05 ENCOUNTER — Other Ambulatory Visit: Payer: Self-pay

## 2024-09-05 ENCOUNTER — Encounter: Payer: Self-pay | Admitting: Hematology

## 2024-09-05 ENCOUNTER — Other Ambulatory Visit (HOSPITAL_COMMUNITY): Payer: Self-pay

## 2024-09-05 MED ORDER — ABIRATERONE ACETATE 250 MG PO TABS: 250.0000 mg | ORAL_TABLET | Freq: Every day | ORAL | 5 refills | Status: AC

## 2024-09-05 NOTE — Addendum Note (Signed)
 Addended by: GEOFFRY STABS F on: 09/05/2024 09:20 AM   Modules accepted: Orders

## 2024-11-27 ENCOUNTER — Inpatient Hospital Stay: Admitting: Hematology

## 2024-11-27 ENCOUNTER — Inpatient Hospital Stay
